# Patient Record
Sex: Male | Born: 1970 | Race: Black or African American | Hispanic: No | Marital: Married | Smoking: Current every day smoker
Health system: Southern US, Community
[De-identification: ages and names within clinical notes are randomized; demographics above are authoritative.]

## PROBLEM LIST (undated history)

## (undated) DIAGNOSIS — I639 Cerebral infarction, unspecified: Secondary | ICD-10-CM

## (undated) DIAGNOSIS — E785 Hyperlipidemia, unspecified: Secondary | ICD-10-CM

## (undated) DIAGNOSIS — I5020 Unspecified systolic (congestive) heart failure: Secondary | ICD-10-CM

## (undated) DIAGNOSIS — R531 Weakness: Secondary | ICD-10-CM

## (undated) HISTORY — DX: Unspecified systolic (congestive) heart failure: I50.20

## (undated) HISTORY — DX: Cerebral infarction, unspecified: I63.9

## (undated) HISTORY — DX: Weakness: R53.1

## (undated) HISTORY — PX: BACK SURGERY: SHX140

## (undated) HISTORY — DX: Hyperlipidemia, unspecified: E78.5

---

## 2009-04-01 ENCOUNTER — Emergency Department (HOSPITAL_COMMUNITY): Admission: EM | Admit: 2009-04-01 | Discharge: 2009-04-01 | Payer: Self-pay | Admitting: Emergency Medicine

## 2011-05-24 ENCOUNTER — Encounter: Payer: Self-pay | Admitting: Emergency Medicine

## 2011-05-24 ENCOUNTER — Emergency Department (HOSPITAL_COMMUNITY)
Admission: EM | Admit: 2011-05-24 | Discharge: 2011-05-24 | Disposition: A | Discharge status: Home / Self Care | Payer: Self-pay | Attending: Emergency Medicine | Admitting: Emergency Medicine

## 2011-05-24 DIAGNOSIS — F17200 Nicotine dependence, unspecified, uncomplicated: Secondary | ICD-10-CM | POA: Insufficient documentation

## 2011-05-24 DIAGNOSIS — R51 Headache: Secondary | ICD-10-CM | POA: Insufficient documentation

## 2011-05-24 MED ORDER — KETOROLAC TROMETHAMINE 30 MG/ML IJ SOLN
30.0000 mg | Freq: Once | INTRAMUSCULAR | Status: AC
  Administered 2011-05-24: 30 mg via INTRAVENOUS
  Filled 2011-05-24: qty 1

## 2011-05-24 MED ORDER — ONDANSETRON HCL 4 MG/2ML IJ SOLN
4.0000 mg | Freq: Once | INTRAMUSCULAR | Status: AC
  Administered 2011-05-24: 4 mg via INTRAVENOUS
  Filled 2011-05-24: qty 2

## 2011-05-24 MED ORDER — SODIUM CHLORIDE 0.9 % IV SOLN
Freq: Once | INTRAVENOUS | Status: AC
  Administered 2011-05-24: 01:00:00 via INTRAVENOUS

## 2011-05-24 MED ORDER — HYDROMORPHONE HCL 1 MG/ML IJ SOLN
1.0000 mg | Freq: Once | INTRAMUSCULAR | Status: AC
  Administered 2011-05-24: 1 mg via INTRAVENOUS
  Filled 2011-05-24: qty 1

## 2011-05-24 NOTE — ED Notes (Signed)
Pt reports improvement in pain following medications.  Denies any pain or nausea at present time.

## 2011-05-24 NOTE — ED Notes (Signed)
Pt reports headache starting today, states that he did have some nausea previously but none at present.  No acute distress noted.

## 2011-05-24 NOTE — ED Provider Notes (Signed)
History     CSN: [id number] Arrival date & time: 05/24/2011 12:38 AM  Chief Complaint  Patient presents with  . Headache   HPI Comments: Seen 23. Patient states he had a headache two days ago that responded to tylenol. Yesterday afternoon developed a fever. Had a single episode of vomiting. Denies vision changes, hearing changes, difficulty swallowing or speaking, stiff neck, fever, chills. Did not take any medicines for the headache.  Patient is a 40 y.o. male presenting with headaches. The history is provided by the patient.  Headache  This is a new problem. The current episode started yesterday. The problem occurs constantly. The problem has not changed since onset.The headache is associated with nothing. The pain is located in the bilateral region. The quality of the pain is described as dull and throbbing. The pain is at a severity of 6/10. The pain is moderate. The pain does not radiate. Pertinent negatives include no fever, no chest pressure, no nausea and no vomiting. He has tried nothing for the symptoms.    History reviewed. No pertinent past medical history.  Past Surgical History  Procedure Date  . Back surgery     No family history on file.  History  Substance Use Topics  . Smoking status: Current Everyday Smoker -- 1.0 packs/day  . Smokeless tobacco: Not on file  . Alcohol Use: No      Review of Systems  Constitutional: Negative for fever.  Gastrointestinal: Negative for nausea and vomiting.  Neurological: Positive for headaches.  All other systems reviewed and are negative.    Physical Exam  BP 154/91  Pulse 75  Temp(Src) 98.5 F (36.9 C) (Oral)  Resp 16  Ht 5\' 11"  (1.803 m)  Wt 150 lb (68.04 kg)  BMI 20.92 kg/m2  Physical Exam  Nursing note and vitals reviewed. Constitutional: He is oriented to person, place, and time. He appears well-developed and well-nourished.  HENT:  Head: Normocephalic and atraumatic.  Right Ear: External ear normal.    Left Ear: External ear normal.  Nose: Nose normal.  Mouth/Throat: Oropharynx is clear and moist.  Eyes: Conjunctivae and EOM are normal. Pupils are equal, round, and reactive to light.  Neck: Normal range of motion. Neck supple.  Cardiovascular: Normal rate, normal heart sounds and intact distal pulses.   Pulmonary/Chest: Effort normal and breath sounds normal.  Abdominal: Soft.  Musculoskeletal: Normal range of motion.  Neurological: He is alert and oriented to person, place, and time. He has normal reflexes.  Skin: Skin is warm and dry.    ED Course  Procedures  Patient who presents with a headache, no neurological deficits. Resolved with analgesics and toradol. Pt feels improved after observation and/or treatment in ED. MDM Reviewed: nursing note and vitals        Nicoletta Dress. Colon Branch, MD 05/24/11 [id number]

## 2011-05-24 NOTE — ED Notes (Signed)
Headache, nausea, vomiting 1 time

## 2013-09-25 ENCOUNTER — Emergency Department (HOSPITAL_COMMUNITY)
Admission: EM | Admit: 2013-09-25 | Discharge: 2013-09-26 | Disposition: A | Discharge status: Home / Self Care | Payer: BC Managed Care – PPO | Attending: Emergency Medicine | Admitting: Emergency Medicine

## 2013-09-25 ENCOUNTER — Emergency Department (HOSPITAL_COMMUNITY): Payer: BC Managed Care – PPO

## 2013-09-25 ENCOUNTER — Encounter (HOSPITAL_COMMUNITY): Payer: Self-pay | Admitting: Emergency Medicine

## 2013-09-25 DIAGNOSIS — Z792 Long term (current) use of antibiotics: Secondary | ICD-10-CM | POA: Insufficient documentation

## 2013-09-25 DIAGNOSIS — K1120 Sialoadenitis, unspecified: Secondary | ICD-10-CM

## 2013-09-25 DIAGNOSIS — J029 Acute pharyngitis, unspecified: Secondary | ICD-10-CM | POA: Insufficient documentation

## 2013-09-25 DIAGNOSIS — K029 Dental caries, unspecified: Secondary | ICD-10-CM

## 2013-09-25 DIAGNOSIS — F17200 Nicotine dependence, unspecified, uncomplicated: Secondary | ICD-10-CM | POA: Insufficient documentation

## 2013-09-25 DIAGNOSIS — M542 Cervicalgia: Secondary | ICD-10-CM | POA: Insufficient documentation

## 2013-09-25 LAB — CBC WITH DIFFERENTIAL/PLATELET
BASOS ABS: 0 10*3/uL (ref 0.0–0.1)
Basophils Relative: 0 % (ref 0–1)
EOS ABS: 0.1 10*3/uL (ref 0.0–0.7)
EOS PCT: 1 % (ref 0–5)
HEMATOCRIT: 39.4 % (ref 39.0–52.0)
Hemoglobin: 13.9 g/dL (ref 13.0–17.0)
Lymphocytes Relative: 21 % (ref 12–46)
Lymphs Abs: 2.3 10*3/uL (ref 0.7–4.0)
MCH: 32.2 pg (ref 26.0–34.0)
MCHC: 35.3 g/dL (ref 30.0–36.0)
MCV: 91.2 fL (ref 78.0–100.0)
MONO ABS: 1 10*3/uL (ref 0.1–1.0)
Monocytes Relative: 9 % (ref 3–12)
Neutro Abs: 7.7 10*3/uL (ref 1.7–7.7)
Neutrophils Relative %: 69 % (ref 43–77)
PLATELETS: 208 10*3/uL (ref 150–400)
RBC: 4.32 MIL/uL (ref 4.22–5.81)
RDW: 13.2 % (ref 11.5–15.5)
WBC: 11.1 10*3/uL — ABNORMAL HIGH (ref 4.0–10.5)

## 2013-09-25 LAB — BASIC METABOLIC PANEL
BUN: 7 mg/dL (ref 6–23)
CALCIUM: 9.7 mg/dL (ref 8.4–10.5)
CO2: 27 mEq/L (ref 19–32)
CREATININE: 0.97 mg/dL (ref 0.50–1.35)
Chloride: 100 mEq/L (ref 96–112)
GFR calc Af Amer: >90 mL/min (ref >90)
GLUCOSE: 93 mg/dL (ref 70–99)
Potassium: 3.8 mEq/L (ref 3.7–5.3)
SODIUM: 141 meq/L (ref 137–147)

## 2013-09-25 MED ORDER — IOHEXOL 300 MG/ML  SOLN
75.0000 mL | Freq: Once | INTRAMUSCULAR | Status: AC | PRN
  Administered 2013-09-25: 75 mL via INTRAVENOUS

## 2013-09-25 MED ORDER — SODIUM CHLORIDE 0.9 % IV SOLN: Freq: Once | INTRAVENOUS | Status: DC

## 2013-09-25 MED ORDER — SODIUM CHLORIDE 0.9 % IV SOLN
Freq: Once | INTRAVENOUS | Status: AC
  Administered 2013-09-25: 23:00:00 via INTRAVENOUS

## 2013-09-25 MED ORDER — KETOROLAC TROMETHAMINE 30 MG/ML IJ SOLN
30.0000 mg | Freq: Once | INTRAMUSCULAR | Status: AC
  Administered 2013-09-25: 30 mg via INTRAVENOUS
  Filled 2013-09-25: qty 1

## 2013-09-25 MED ORDER — METHYLPREDNISOLONE SODIUM SUCC 125 MG IJ SOLR
125.0000 mg | Freq: Once | INTRAMUSCULAR | Status: AC
  Administered 2013-09-25: 125 mg via INTRAVENOUS
  Filled 2013-09-25: qty 2

## 2013-09-25 MED ORDER — CLINDAMYCIN PHOSPHATE 900 MG/50ML IV SOLN
900.0000 mg | Freq: Once | INTRAVENOUS | Status: AC
Start: 2013-09-25 — End: 2013-09-25
  Administered 2013-09-25: 900 mg via INTRAVENOUS
  Filled 2013-09-25: qty 50

## 2013-09-25 NOTE — ED Notes (Signed)
Pt c/o sore throat x 2 days and [facility] but is not getting any better.

## 2013-09-25 NOTE — ED Notes (Signed)
Pt transferred to room 6. Report given to S. Rivka BarbaraFrye-Yarborough, Charity fundraiserN.

## 2013-09-25 NOTE — ED Provider Notes (Signed)
CSN: [id number]     Arrival date & time 09/25/13  2007 History   First MD Initiated Contact with Patient 09/25/13 2044     Chief Complaint  Patient presents with  . Sore Throat   (Consider location/radiation/quality/duration/timing/severity/associated sxs/prior Treatment) HPI Comments: Jermaine Shaw is a 43 y.o. male who presents to the Emergency Department complaining of right sided neck pain and pain underneath his tongue for 2 days.  States that he was seen at another facility earlier today for same and was diagnosed with pharyngitis and given prescriptions for Hycodan syrup and zithromax.  He states the swelling under his tongue has become worse this evening and painful to swallow.  Patient also c/o generalized headache today that has been gradual in onset and similar to previous.  He denies recent illness, fever, cough, chills, or difficulty swallowing or breathing.  Patient also admits to having multiple dental caries.    The history is provided by the patient.    History reviewed. No pertinent past medical history. Past Surgical History  Procedure Laterality Date  . Back surgery     History reviewed. No pertinent family history. History  Substance Use Topics  . Smoking status: Current Every Day Smoker -- 1.00 packs/day  . Smokeless tobacco: Not on file  . Alcohol Use: Yes    Review of Systems  Constitutional: Negative for fever and appetite change.  HENT: Positive for dental problem, sore throat, trouble swallowing and voice change. Negative for congestion, ear pain, facial swelling and hearing loss.   Eyes: Negative for pain and visual disturbance.  Respiratory: Negative for cough, chest tightness and shortness of breath.   Cardiovascular: Negative for chest pain.  Gastrointestinal: Negative for nausea, vomiting and abdominal distention.  Musculoskeletal: Negative for neck pain and neck stiffness.  Skin: Negative for rash.  Neurological: Negative for dizziness, syncope,  facial asymmetry, weakness and headaches.  Hematological: Negative for adenopathy.  All other systems reviewed and are negative.    Allergies  Review of patient's allergies indicates no known allergies.  Home Medications   Current Outpatient Rx  Name  Route  Sig  Dispense  Refill  . azithromycin (ZITHROMAX) 250 MG tablet   Oral   Take 1-2 tablets by mouth daily. Started on 09/25/13         . HYDROcodone-homatropine (HYCODAN) 5-1.5 MG/5ML syrup   Oral   Take 5 mLs by mouth every 6 (six) hours as needed. cough          BP 154/84  Pulse 98  Temp(Src) 99.7 F (37.6 C) (Oral)  Resp 20  Ht 5' 10.5" (1.791 m)  Wt 140 lb (63.504 kg)  BMI 19.80 kg/m2  SpO2 100%   Physical Exam  Nursing note and vitals reviewed. Constitutional: He is oriented to person, place, and time. He appears well-developed and well-nourished. No distress.  HENT:  Head: Normocephalic and atraumatic.  Right Ear: Tympanic membrane and ear canal normal. No mastoid tenderness. No hemotympanum.  Left Ear: Tympanic membrane and ear canal normal. No mastoid tenderness. No hemotympanum.  Nose: Nose normal.  Mouth/Throat: Uvula is midline and mucous membranes are normal. No trismus in the jaw. No uvula swelling. No oropharyngeal exudate, posterior oropharyngeal edema or tonsillar abscesses.    Serous appearing edema of the sublingual region.  No edema of the tongue.  Multiple dental caries and loosening of the right lower central and lateral incisor, cuspid .  No trismus, edema of the gums or obvious dental abscess  Cardiovascular:  Normal rate, regular rhythm, normal heart sounds and intact distal pulses.   No murmur heard. Pulmonary/Chest: Effort normal and breath sounds normal. No respiratory distress. He has no wheezes. He has no rales. He exhibits no tenderness.  Abdominal: Soft. He exhibits no distension. There is no tenderness.  Musculoskeletal: Normal range of motion.  Neurological: He is alert and  oriented to person, place, and time. He displays normal reflexes. He exhibits normal muscle tone. Coordination normal.  Skin: Skin is warm and dry. No rash noted.    ED Course  Procedures (including critical care time) Labs Review Labs Reviewed  CBC WITH DIFFERENTIAL - Abnormal; Notable for the following:    WBC 11.1 (*)    All other components within normal limits  BASIC METABOLIC PANEL   Imaging Review Ct Soft Tissue Neck W Contrast  09/25/2013   CLINICAL DATA:  Sore throat for 2 days.  No improvement.  EXAM: CT NECK WITH CONTRAST  TECHNIQUE: Multidetector CT imaging of the neck was performed using the standard protocol following the bolus administration of intravenous contrast.  CONTRAST:  75mL OMNIPAQUE IOHEXOL 300 MG/ML  SOLN  COMPARISON:  None.  FINDINGS: There is a 4 mm pulmonary nodule, partly imaged in the left upper lobe.  Numerous dental cavities and periapical erosions. Polypoid mucosal thickening in the inferior left maxillary antrum may be odontogenic given the remaining left upper molar has a periapical erosion extending towards the sinus.  There is edema in the soft tissues surrounding the right submandibular gland, which is interstitial edema and mild asymmetric enlargement. No visible stone. No visible ductal dilatation. The other salivary glands appear normal. There is mild local lymphadenopathy. No fluid collection.  Unremarkable thyroid gland. No evidence of mass along the surfaces of the aerodigestive tract.  Prominent number of upper mediastinal lymph nodes. None are enlarged, although there is incomplete imaging of a left lower paratracheal node which measures 12 mm in short axis.  IMPRESSION: 1. Right submandibular sialoadenitis.  No sialolithiasis. 2. Diffuse endodontal/periodontal disease. 3. 4 mm left upper lobe pulmonary nodule. If smoking history see following recommendations.  RECOMMENDATIONS: If the patient is at high risk for bronchogenic carcinoma, follow-up chest CT  at 1year is recommended. If the patient is at low risk, no follow-up is needed. This recommendation follows the consensus statement: Guidelines for Management of Small Pulmonary Nodules Detected on CT Scans: A Statement from the Fleischner Society as published in Radiology 2005; 237:395-400.   Electronically Signed   By: Tiburcio Pea M.D.   On: 09/25/2013 23:01    EKG Interpretation   None       MDM    Patient with sublingual edema and several dental caries.  Palpable nodule to the right submental region.  Airway is patent.  Patient also seen by EDP, Dr. Adriana Simas and care plan discussed.    Will order labs and CT soft tissue neck, IVF's, clindamycin and solumedrol   0100  Patient is feeling better, has received IVF's.  Handles secretions well.  Airway remains patent. Discussed CT findings with the patient and he agrees to close f/u in 1-2 days with his PMD.  Dr. Lysbeth Galas.  I will have him d/c the Zithromax and prescribe clindamycin and prednisone taper.  I have also advised the patient of the left pulmonary nodule seen on the CT scan and recommendations for close f/u with PMD given that patient is a smoker.  Patient verbalized understanding and agrees to care plan.    Jermaine Shaw L.  Trisha Mangle, PA-C 09/26/13 0129

## 2013-09-26 MED ORDER — CLINDAMYCIN HCL 150 MG PO CAPS: 300.0000 mg | ORAL_CAPSULE | Freq: Four times a day (QID) | ORAL | Status: DC

## 2013-09-26 MED ORDER — PREDNISONE 10 MG PO TABS: ORAL_TABLET | ORAL | Status: DC

## 2013-09-26 NOTE — Discharge Instructions (Signed)
Dental Caries Dental caries is tooth decay. This decay can cause a hole in teeth (cavity) that can get bigger and deeper over time. HOME CARE  Brush and floss your teeth. Do this at least two times a day.  Use a fluoride toothpaste.  Use a mouth rinse if told by your dentist or doctor.  Eat less sugary and starchy foods. Drink less sugary drinks.  Avoid snacking often on sugary and starchy foods. Avoid sipping often on sugary drinks.  Keep regular checkups and cleanings with your dentist.  Use fluoride supplements if told by your dentist or doctor.  Allow fluoride to be applied to teeth if told by your dentist or doctor. MAKE SURE YOU:  Understand these instructions.  Will watch your condition.  Will get help right away if you are not doing well or get worse. Document Released: 06/07/2008 Document Revised: 05/01/2013 Document Reviewed: 08/31/2012 [facility] Patient Information 2014 [place] [place].  Sialadenitis Sialadenitis is an inflammation (soreness) of the salivary glands. The parotid is the main salivary gland. It lies behind the angle of the jaw below the ear. The saliva produced comes out of a tiny opening (duct) inside the cheek on either side. This is usually at the level of the upper back teeth. If it is swollen, the ear is pushed up and out. This helps tell this condition apart from a simple lymph gland infection (swollen glands) in the same area. Mumps has mostly disappeared since the start of immunization against mumps. Now the most common cause of parotitis is germ (bacterial) infection or inflammation of the lymphatics (the lymph channels). The other major salivary gland is located in the floor of the mouth. Smaller salivary glands are located in the mouth. This includes the:  Lips.  Lining of the mouth.  Pharynx.  Hard palate (front part of the roof of the mouth). The salivary glands do many things, including:  Lubrication.  Breaking down food.  Production  of hormones and antibodies (to protect against germs which may cause illness).  Help with the sense of taste. ACUTE BACTERIAL SIALADENITIS This is a sudden inflammatory response to bacterial infection. This causes redness, pain, swelling and tenderness over the infected gland. In the past, it was common in dehydrated and debilitated patients often following an operation. It is now more commonly seen:  After radiotherapy.  In patients with poor immune systems. Treatment is:  The correction of fluid balance (rehydration).  Medicine that kill germs (antibiotics).  Pain relief. CHRONIC RECURRENT SIALADENITIS This refers to repeated episodes of discomfort and swelling of one of the salivary glands. It often occurs after eating. Chronic sialadenitis is usually less painful. It is associated with recurrent enlargement of a salivary gland, often following meals, and typically with an absence of redness. The chronic form of the disease often is associated with conditions linked to decreased salivary flow, rather than dehydration (loss of body fluids). These conditions include:  A stone, or concretion, formed in the gallbladder, kidneys, or other parts of the body (calculi).  Salivary stasis.  A change in the fluid and electrolyte (the salts in your body fluids) makeup of the gland. It is treated with:  Gland massage.  Methods to stimulate the flow of saliva, (for example, lemon juice).  Antibiotics if required. Surgery to remove the gland is possible, but its benefits need to be balanced against risks.  VIRAL SIALADENITIS Several viruses infect the salivary glands. Some of these include the mumps virus that commonly infects the parotid gland.  Other viruses causing problems are:  The HIV virus.  Herpes.  Some of the influenza ("flu") viruses. RECURRENT SIALADENITIS IN CHILDREN This condition is thought to be due to swelling or ballooning of the ducts. It results in the same symptoms as  acute bacterial parotitis. It is usually caused by germs (bacteria). It is often treated using penicillin. It may get well without treatment. Surgery is usually not required. TUBERCULOUS SIALADENITIS The salivary glands may become infected with the same bacteria causing tuberculosis ("TB"). Treatment is with anti-tuberculous antibiotic therapy. OTHER UNCOMMON CAUSES OF SIALADENITIS   Sjogren's syndrome is a condition in which arthritis is associated with a decrease in activity of the glands of the body that produce saliva and tears. The diagnosis is made with blood tests or by examination of a piece of tissue from the inside of the lip. Some people with this condition are bothered by:  A dry mouth.  Intermittent salivary gland enlargement.  Atypical mycobacteria is a germ similar to tuberculosis. It often infects children. It is often resistant to antibiotic treatment. It may require surgical treatment to remove the infected salivary gland.  Actinomycosis is an infection of the parotid gland that may also involve the overlying skin. The diagnosis is made by detecting granules of sulphur produced by the bacteria on microscopic examination. Treatment is a prolonged course of penicillin for up to one year.  Nutritional causes include vitamin deficiencies and bulimia.  Diabetes and problems with your thyroid.  Obesity, cirrhosis, and malabsorption are some metabolic causes. HOME CARE INSTRUCTIONS   Apply ice bags every 2 hours for 15-20 minutes, while awake, to the sore gland for 24 hours, then as directed by your caregiver. Place the ice in a plastic bag with a towel around it to prevent frostbite to the skin.  Only take over-the-counter or prescription medicines for pain, discomfort, or fever as directed by your caregiver. SEEK IMMEDIATE MEDICAL CARE IF:   There is increased pain or swelling in your gland that is not controlled with medicine.  An oral temperature above 102 F (38.9 C)  develops, not controlled by medicine.  You develop difficulty opening your mouth, swallowing, or speaking. Document Released: 02/18/2002 Document Revised: 11/21/2011 Document Reviewed: 04/14/2008 [facility] Patient Information 2014 [place], [place].

## 2013-09-28 ENCOUNTER — Encounter (HOSPITAL_COMMUNITY): Payer: Self-pay | Admitting: Emergency Medicine

## 2013-09-28 ENCOUNTER — Emergency Department (HOSPITAL_COMMUNITY)
Admission: EM | Admit: 2013-09-28 | Discharge: 2013-09-28 | Disposition: A | Discharge status: Home / Self Care | Payer: BC Managed Care – PPO | Attending: Emergency Medicine | Admitting: Emergency Medicine

## 2013-09-28 ENCOUNTER — Emergency Department (HOSPITAL_COMMUNITY): Payer: BC Managed Care – PPO

## 2013-09-28 DIAGNOSIS — R110 Nausea: Secondary | ICD-10-CM | POA: Insufficient documentation

## 2013-09-28 DIAGNOSIS — IMO0002 Reserved for concepts with insufficient information to code with codable children: Secondary | ICD-10-CM | POA: Insufficient documentation

## 2013-09-28 DIAGNOSIS — J189 Pneumonia, unspecified organism: Secondary | ICD-10-CM

## 2013-09-28 DIAGNOSIS — Z792 Long term (current) use of antibiotics: Secondary | ICD-10-CM | POA: Insufficient documentation

## 2013-09-28 DIAGNOSIS — J159 Unspecified bacterial pneumonia: Secondary | ICD-10-CM | POA: Insufficient documentation

## 2013-09-28 DIAGNOSIS — F17200 Nicotine dependence, unspecified, uncomplicated: Secondary | ICD-10-CM | POA: Insufficient documentation

## 2013-09-28 MED ORDER — IBUPROFEN 800 MG PO TABS
800.0000 mg | ORAL_TABLET | Freq: Once | ORAL | Status: AC
  Administered 2013-09-28: 800 mg via ORAL
  Filled 2013-09-28: qty 1

## 2013-09-28 MED ORDER — CEFTRIAXONE SODIUM 1 G IJ SOLR
1.0000 g | Freq: Once | INTRAMUSCULAR | Status: AC
  Administered 2013-09-28: 1 g via INTRAMUSCULAR
  Filled 2013-09-28: qty 10

## 2013-09-28 MED ORDER — AZITHROMYCIN 250 MG PO TABS
500.0000 mg | ORAL_TABLET | Freq: Once | ORAL | Status: AC
  Administered 2013-09-28: 500 mg via ORAL
  Filled 2013-09-28: qty 2

## 2013-09-28 NOTE — ED Provider Notes (Signed)
Medical screening examination/treatment/procedure(s) were conducted as a shared visit with non-physician practitioner(s) and myself.  I personally evaluated the patient during the encounter.  EKG Interpretation   None      No meningeal signs.  CT scan shows right submandibular sialoadenitis. No sialolithiasis.  Good airway. Swallowing normally.  Recommended recheck within 24 hours.  Donnetta HutchingBrian Alaijah Gibler, MD 09/28/13 [phone]

## 2013-09-28 NOTE — ED Notes (Signed)
Pt co fever, chills, generalized body aches, headache since midnight

## 2013-09-28 NOTE — ED Notes (Signed)
Pt states he has had a headache since he "got the chills" and states he has felt feverish since midnight.

## 2013-09-28 NOTE — ED Notes (Signed)
Pt awaiting shot time, can be dc at 0956

## 2013-09-28 NOTE — ED Provider Notes (Signed)
CSN: [id number]     Arrival date & time 09/28/13  0804 History   First MD Initiated Contact with Patient 09/28/13 [phone]     Chief Complaint  Patient presents with  . Influenza     flu like illness   (Consider location/radiation/quality/duration/timing/severity/associated sxs/prior Treatment) Patient is a 43 y.o. male presenting with flu symptoms. The history is provided by the patient. No language interpreter was used.  Influenza Presenting symptoms: cough, fever, myalgias and nausea   Severity:  Moderate Onset quality:  Sudden Duration:  1 day Progression:  Worsening Chronicity:  New Relieved by:  Nothing Worsened by:  Nothing tried Ineffective treatments:  None tried Associated symptoms: chills   Risk factors: sick contacts   Risk factors: no immunocompromised state     History reviewed. No pertinent past medical history. Past Surgical History  Procedure Laterality Date  . Back surgery     History reviewed. No pertinent family history. History  Substance Use Topics  . Smoking status: Current Every Day Smoker -- 1.00 packs/day  . Smokeless tobacco: Not on file  . Alcohol Use: Yes    Review of Systems  Constitutional: Positive for fever and chills.  Respiratory: Positive for cough.   Gastrointestinal: Positive for nausea.  Musculoskeletal: Positive for myalgias.  All other systems reviewed and are negative.    Allergies  Review of patient's allergies indicates no known allergies.  Home Medications   Current Outpatient Rx  Name  Route  Sig  Dispense  Refill  . azithromycin (ZITHROMAX) 250 MG tablet   Oral   Take 1-2 tablets by mouth daily. Started on 09/25/13         . clindamycin (CLEOCIN) 150 MG capsule   Oral   Take 2 capsules (300 mg total) by mouth 4 (four) times daily. For 7 days   56 capsule   0   . HYDROcodone-homatropine (HYCODAN) 5-1.5 MG/5ML syrup   Oral   Take 5 mLs by mouth every 6 (six) hours as needed. cough         . predniSONE  (DELTASONE) 10 MG tablet      Take 6 tablets day one, 5 tablets day two, 4 tablets day three, 3 tablets day four, 2 tablets day five, then 1 tablet day six   21 tablet   0    BP 115/62  Pulse 117  Temp(Src) 101 F (38.3 C) (Oral)  Resp 16  SpO2 98% Physical Exam  Nursing note and vitals reviewed. Constitutional: He is oriented to person, place, and time. He appears well-developed and well-nourished.  HENT:  Head: Normocephalic and atraumatic.  Eyes: Conjunctivae and EOM are normal. Pupils are equal, round, and reactive to light.  Neck: Normal range of motion.  Cardiovascular: Normal rate and normal heart sounds.   Pulmonary/Chest: Effort normal and breath sounds normal.  Abdominal: Soft. He exhibits no distension.  Musculoskeletal: Normal range of motion.  Neurological: He is alert and oriented to person, place, and time.  Skin: Skin is warm.  Psychiatric: He has a normal mood and affect.    ED Course  Procedures (including critical care time) Labs Review Labs Reviewed - No data to display Imaging Review Dg Chest 2 View  09/28/2013   CLINICAL DATA:  Cough and congestion  EXAM: CHEST  2 VIEW  COMPARISON:  04/01/2009  FINDINGS: Heart size and vascular pattern normal. Left lung is clear. There is mild patchy posterior root medial right lower lobe infiltrate.  IMPRESSION: Right lower lobe infiltrate  concerning for pneumonia.   Electronically Signed   By: Esperanza Heir M.D.   On: 09/28/2013 09:12    EKG Interpretation   None       MDM rll pneumonia.  Pt given rocephin and zithromax.   Pt given rx for zithromax.  Pt advised to follow up with primary MD for recheck.  Return if any problems.   1. Community acquired pneumonia     Lonia Skinner Gamaliel, [place] 09/28/13 [phone]

## 2013-09-28 NOTE — Discharge Instructions (Signed)

## 2013-09-28 NOTE — ED Notes (Signed)
Discharge instructions reviewed with pt, questions answered. Pt verbalized understanding.  

## 2013-09-28 NOTE — ED Provider Notes (Signed)
Medical screening examination/treatment/procedure(s) were performed by non-physician practitioner and as supervising physician I was immediately available for consultation/collaboration.  EKG Interpretation   None        Donnetta HutchingBrian Kennette Cuthrell, MD 09/28/13 1431

## 2014-05-07 ENCOUNTER — Emergency Department (HOSPITAL_COMMUNITY)
Admission: EM | Admit: 2014-05-07 | Discharge: 2014-05-07 | Disposition: A | Discharge status: Home / Self Care | Payer: BC Managed Care – PPO | Attending: Emergency Medicine | Admitting: Emergency Medicine

## 2014-05-07 ENCOUNTER — Encounter (HOSPITAL_COMMUNITY): Payer: Self-pay | Admitting: Emergency Medicine

## 2014-05-07 DIAGNOSIS — F17200 Nicotine dependence, unspecified, uncomplicated: Secondary | ICD-10-CM | POA: Insufficient documentation

## 2014-05-07 DIAGNOSIS — G43D0 Abdominal migraine, not intractable: Secondary | ICD-10-CM

## 2014-05-07 DIAGNOSIS — G43809 Other migraine, not intractable, without status migrainosus: Secondary | ICD-10-CM | POA: Insufficient documentation

## 2014-05-07 DIAGNOSIS — R112 Nausea with vomiting, unspecified: Secondary | ICD-10-CM | POA: Diagnosis present

## 2014-05-07 MED ORDER — KETOROLAC TROMETHAMINE 30 MG/ML IJ SOLN
30.0000 mg | Freq: Once | INTRAMUSCULAR | Status: AC
  Administered 2014-05-07: 30 mg via INTRAVENOUS
  Filled 2014-05-07: qty 1

## 2014-05-07 MED ORDER — METOCLOPRAMIDE HCL 5 MG/ML IJ SOLN
10.0000 mg | Freq: Once | INTRAMUSCULAR | Status: AC
  Administered 2014-05-07: 10 mg via INTRAVENOUS
  Filled 2014-05-07: qty 2

## 2014-05-07 MED ORDER — METOCLOPRAMIDE HCL 10 MG PO TABS: 10.0000 mg | ORAL_TABLET | Freq: Four times a day (QID) | ORAL | Status: DC | PRN

## 2014-05-07 MED ORDER — SODIUM CHLORIDE 0.9 % IV BOLUS (SEPSIS)
1000.0000 mL | Freq: Once | INTRAVENOUS | Status: AC
  Administered 2014-05-07: 1000 mL via INTRAVENOUS

## 2014-05-07 MED ORDER — NAPROXEN 500 MG PO TABS: 500.0000 mg | ORAL_TABLET | Freq: Two times a day (BID) | ORAL | Status: DC | PRN

## 2014-05-07 MED ORDER — DIPHENHYDRAMINE HCL 50 MG/ML IJ SOLN
25.0000 mg | Freq: Once | INTRAMUSCULAR | Status: AC
  Administered 2014-05-07: 25 mg via INTRAVENOUS
  Filled 2014-05-07: qty 1

## 2014-05-07 NOTE — ED Notes (Signed)
Pt got off work this morning around 7 and had a little headache at that time but went on to sleep. When he woke up around 2pm , he never could go back to sleep because of the headache. He states he hasn't been able to eat either.

## 2014-05-07 NOTE — ED Provider Notes (Signed)
CSN: [id number]     Arrival date & time 05/07/14  2122 History  This chart was scribed for Rolland Porter, MD by Evon Slack, ED Scribe. This patient was seen in room APA14/APA14 and the patient's care was started at 10:30 PM.      Chief Complaint  Patient presents with  . Migraine  . Nausea   Patient is a 43 y.o. male presenting with migraines. The history is provided by the patient. No language interpreter was used.  Migraine Associated symptoms include headaches. Pertinent negatives include no chest pain, no abdominal pain and no shortness of breath.   HPI Comments: Jermaine Shaw is a 43 y.o. male who presents to the Emergency Department complaining of headache onset this morning. Associated nausea, vomiting, and photophobia. He states that he works third shift and was still present when he woke up. He states he hasn't taken any medications prior to arrival. He states he has a HX of coming to the ED for headaches. Denies fever, neck pain or neck stiffness.    History reviewed. No pertinent past medical history. Past Surgical History  Procedure Laterality Date  . Back surgery     History reviewed. No pertinent family history. History  Substance Use Topics  . Smoking status: Current Every Day Smoker -- 1.00 packs/day  . Smokeless tobacco: Not on file  . Alcohol Use: Yes    Review of Systems  Constitutional: Negative for fever, chills, diaphoresis, appetite change and fatigue.  HENT: Negative for mouth sores, sore throat and trouble swallowing.   Eyes: Positive for photophobia. Negative for visual disturbance.  Respiratory: Negative for cough, chest tightness, shortness of breath and wheezing.   Cardiovascular: Negative for chest pain.  Gastrointestinal: Positive for nausea and vomiting. Negative for abdominal pain, diarrhea and abdominal distention.  Endocrine: Negative for polydipsia, polyphagia and polyuria.  Genitourinary: Negative for dysuria, frequency and hematuria.   Musculoskeletal: Negative for gait problem, neck pain and neck stiffness.  Skin: Negative for color change, pallor and rash.  Neurological: Positive for headaches. Negative for dizziness, syncope and light-headedness.  Hematological: Does not bruise/bleed easily.  Psychiatric/Behavioral: Negative for behavioral problems and confusion.    Allergies  Review of patient's allergies indicates no known allergies.  Home Medications   Prior to Admission medications   Medication Sig Start Date End Date Taking? Authorizing Provider  metoCLOPramide (REGLAN) 10 MG tablet Take 1 tablet (10 mg total) by mouth every 6 (six) hours as needed (headache). 05/07/14   Rolland Porter, MD  naproxen (NAPROSYN) 500 MG tablet Take 1 tablet (500 mg total) by mouth 2 (two) times daily as needed for headache. 05/07/14   Rolland Porter, MD   Triage Vitals: BP 148/87  Pulse 79  Temp(Src) 98.4 F (36.9 C) (Oral)  Resp 20  Ht 5' 10.5" (1.791 m)  SpO2 100%  Physical Exam  Nursing note and vitals reviewed. Constitutional: He is oriented to person, place, and time. He appears well-developed and well-nourished. No distress.  HENT:  Head: Normocephalic.  bilateral retro orbital headache  Eyes: Conjunctivae are normal. Pupils are equal, round, and reactive to light. No scleral icterus.  Neck: Normal range of motion. Neck supple. No thyromegaly present.  Cardiovascular: Normal rate and regular rhythm.  Exam reveals no gallop and no friction rub.   No murmur heard. Pulmonary/Chest: Effort normal and breath sounds normal. No respiratory distress. He has no wheezes. He has no rales.  Abdominal: Soft. Bowel sounds are normal. He exhibits no distension. There  is no tenderness. There is no rebound.  Musculoskeletal: Normal range of motion.  Neurological: He is alert and oriented to person, place, and time.  Skin: Skin is warm and dry. No rash noted.  Psychiatric: He has a normal mood and affect. His behavior is normal.    ED  Course  Procedures (including critical care time) DIAGNOSTIC STUDIES: Oxygen Saturation is 100% on RA, normal by my interpretation.    COORDINATION OF CARE: 10:40 PM-Discussed treatment plan which includes IV medications with pt at bedside and pt agreed to plan.     Labs Review Labs Reviewed - No data to display  Imaging Review No results found.   EKG Interpretation None      MDM   Final diagnoses:  Abdominal migraine, not intractable    Still with near complete resolution of his headache. Discharged home. Prescription for metoclopramide, and naproxen.    I personally performed the services described in this documentation, which was scribed in my presence. The recorded information has been reviewed and is accurate.      Rolland Porter, MD 05/07/14 [phone]

## 2014-05-07 NOTE — Discharge Instructions (Signed)

## 2014-05-07 NOTE — ED Notes (Signed)
Pt reports hx of migraine HA. Did not take any OTC meds for pain relief.

## 2015-05-02 IMAGING — CT CT NECK W/ CM
3 of 4 series · 12 of 33 positions shown, 14 images · IV contrast (Omnipaque 300)
Comparison: None.

CLINICAL DATA: Sore throat for 2 days.  No improvement.

EXAM:
CT NECK WITH CONTRAST
TECHNIQUE: Multidetector CT imaging of the neck was performed using the
standard protocol following the bolus administration of intravenous
contrast.
CONTRAST:  75mL OMNIPAQUE IOHEXOL 300 MG/ML  SOLN

[Series 2: soft tissue neck 2.0 b31s · axial · 0.36mm/px · z∈[+108,+260]mm · 4 of 116 slices shown, 5 images]
[im 20/116  soft-tissue]
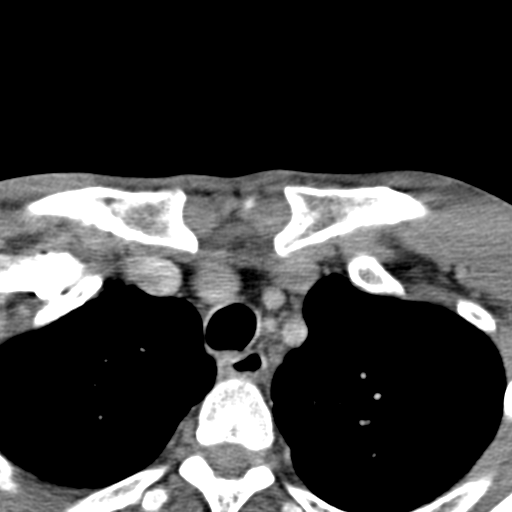
[im 20/116  bone]
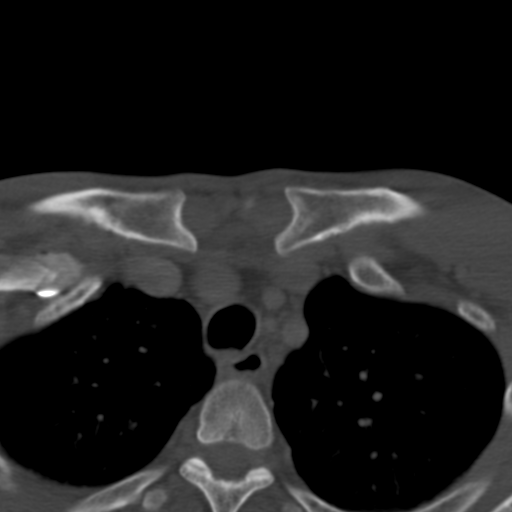
[im 39/116  bone]
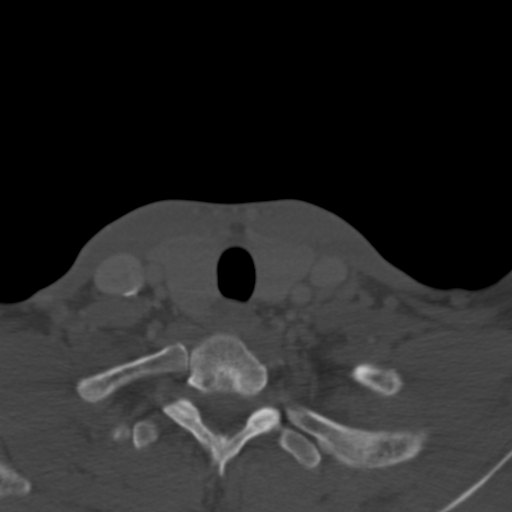
[im 77/116  bone]
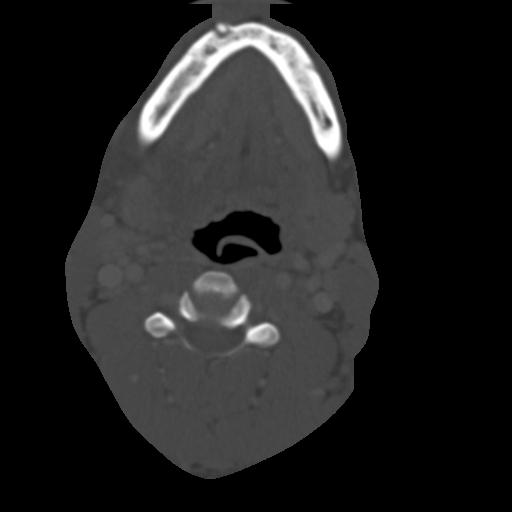
[im 96/116  bone]
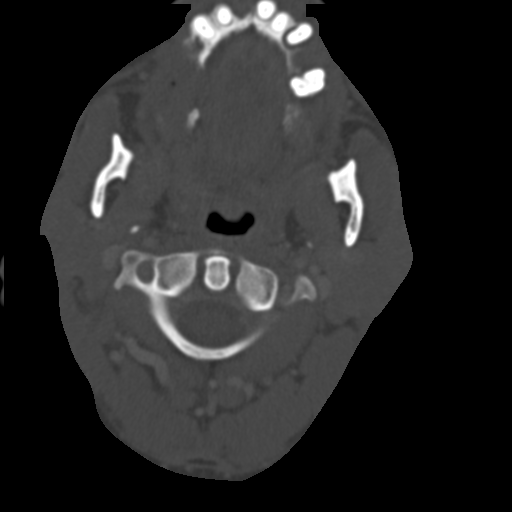

[Series 4: neck 2.0 soft tissue sag · sagittal · 0.32mm/px · 5 of 68 slices shown, 6 images]
[im 23/68  bone]
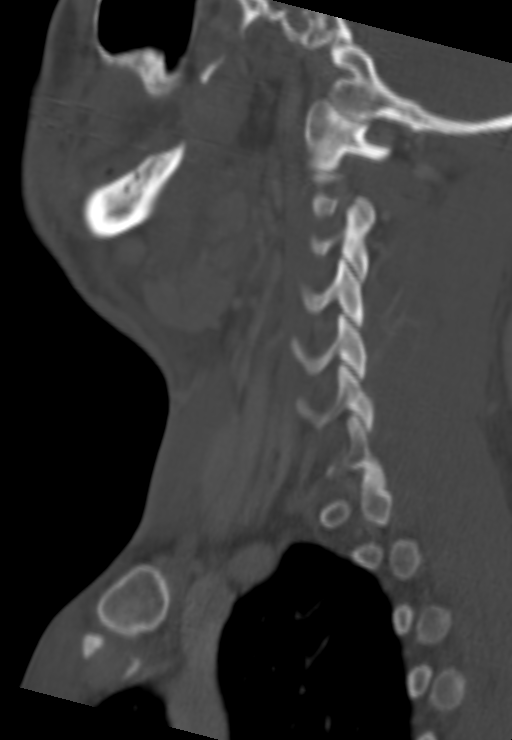
[im 28/68  bone]
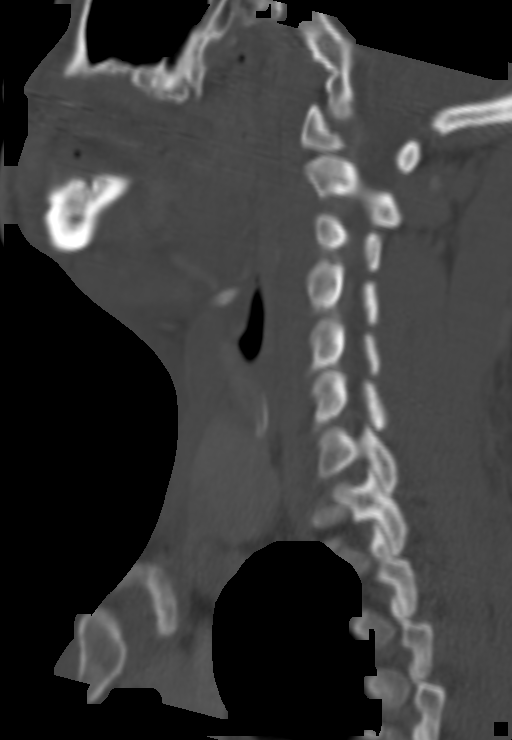
[im 34/68  soft-tissue]
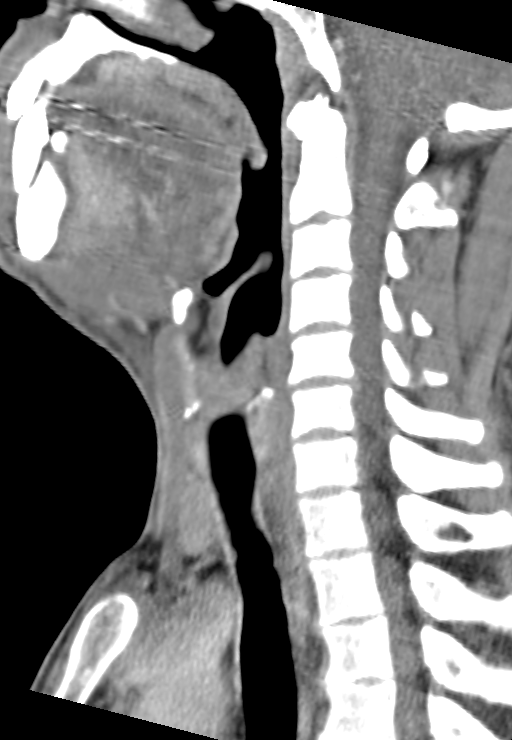
[im 34/68  bone]
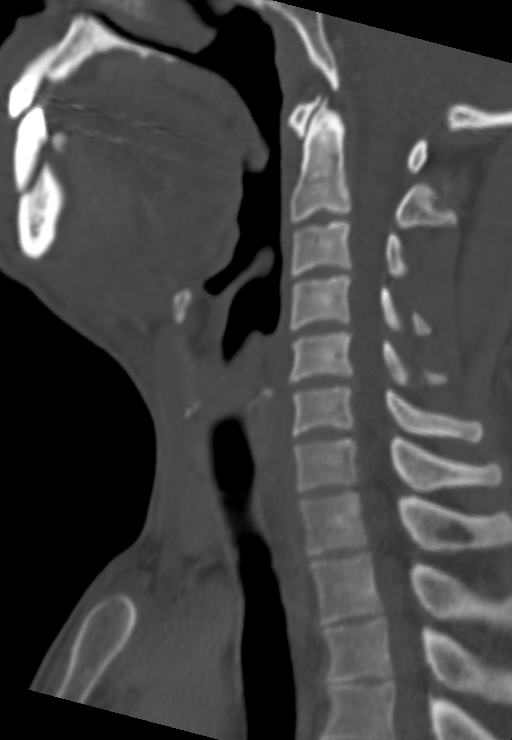
[im 40/68  bone]
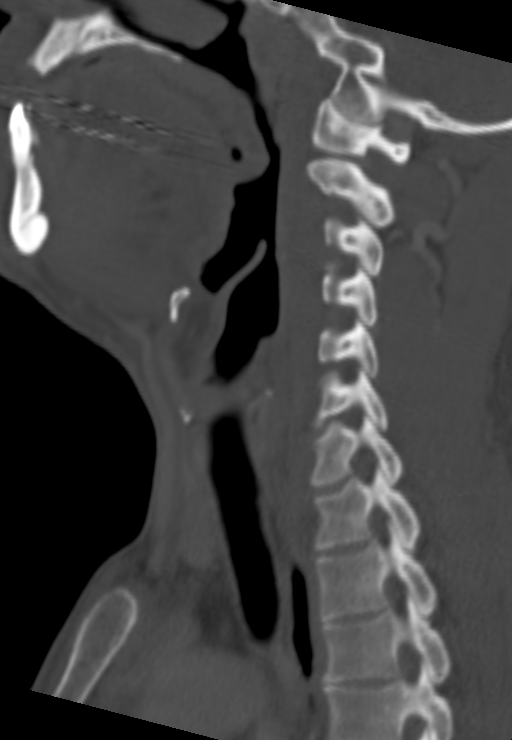
[im 45/68  bone]
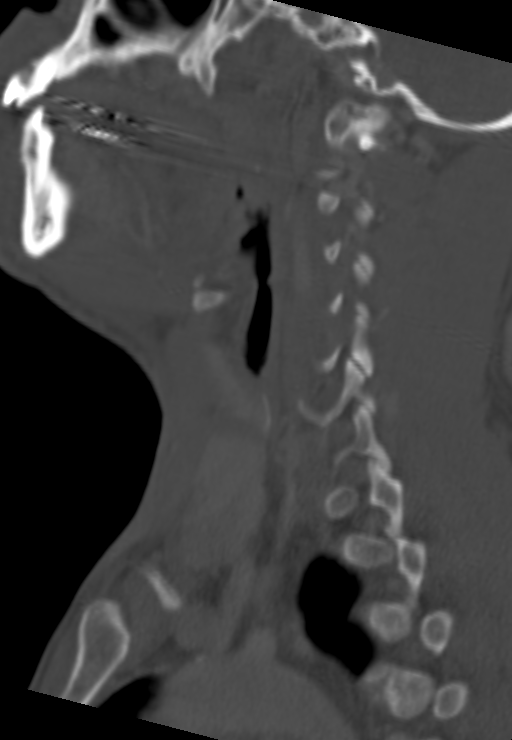

[Series 5: neck 2.0 soft tissue coro · coronal · 0.29mm/px · 3 of 83 slices shown]
[im 17/83  bone]
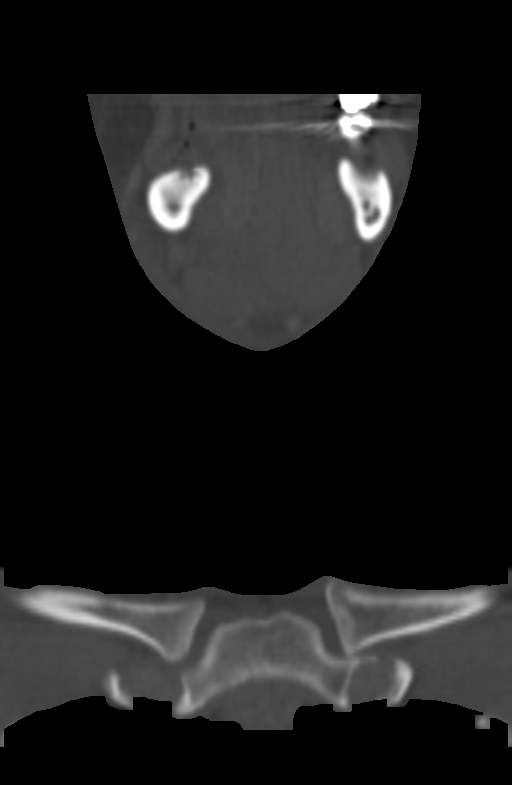
[im 33/83  bone]
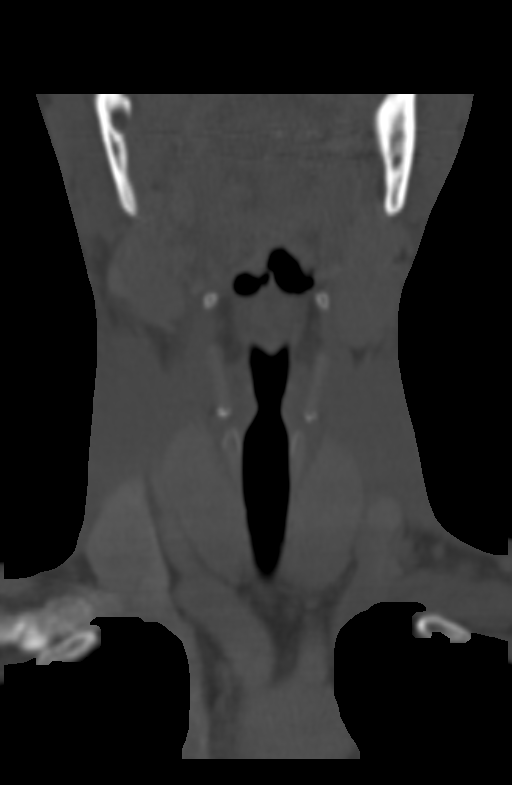
[im 50/83  bone]
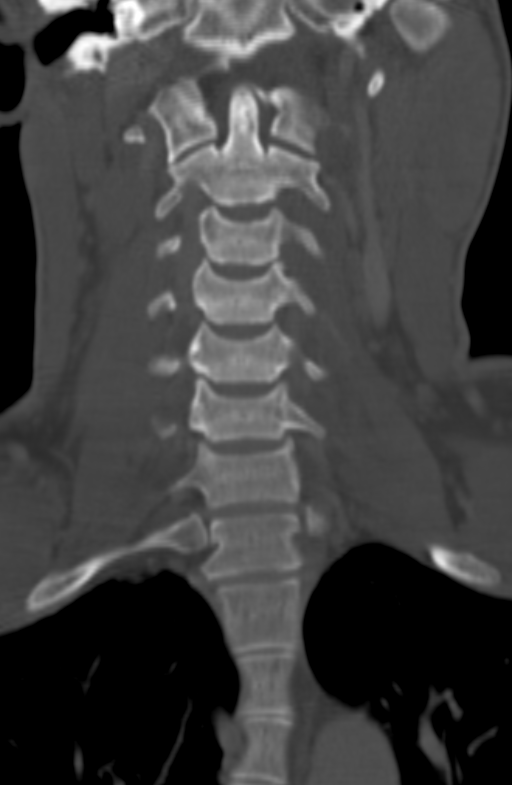

[12 of 33 positions shown; findings below may reference images not displayed]

FINDINGS: There is a 4 mm pulmonary nodule, partly imaged in the left upper
lobe.

Numerous dental cavities and periapical erosions. Polypoid mucosal
thickening in the inferior left maxillary antrum may be odontogenic
given the remaining left upper molar has a periapical erosion
extending towards the sinus.

There is edema in the soft tissues surrounding the right
submandibular gland, which is interstitial edema and mild asymmetric
enlargement. No visible stone. No visible ductal dilatation. The
other salivary glands appear normal. There is mild local
lymphadenopathy. No fluid collection.

Unremarkable thyroid gland. No evidence of mass along the surfaces
of the aerodigestive tract.

Prominent number of upper mediastinal lymph nodes. None are
enlarged, although there is incomplete imaging of a left lower
paratracheal node which measures 12 mm in short axis.
IMPRESSION: 1. Right submandibular sialoadenitis.  No sialolithiasis.
2. Diffuse endodontal/periodontal disease.
3. 4 mm left upper lobe pulmonary nodule. If smoking history see
following recommendations.

RECOMMENDATIONS:
If the patient is at high risk for bronchogenic carcinoma, follow-up
chest CT at 9year is recommended. If the patient is at low risk, no
follow-up is needed. This recommendation follows the consensus
statement: Guidelines for Management of Small Pulmonary Nodules
Detected on CT Scans: A Statement from the [HOSPITAL] as

## 2023-01-30 ENCOUNTER — Emergency Department (HOSPITAL_COMMUNITY): Payer: BC Managed Care – PPO

## 2023-01-30 ENCOUNTER — Other Ambulatory Visit (HOSPITAL_COMMUNITY): Payer: Self-pay | Admitting: *Deleted

## 2023-01-30 ENCOUNTER — Inpatient Hospital Stay (HOSPITAL_COMMUNITY)
Admission: EM | Admit: 2023-01-30 | Discharge: 2023-02-01 | DRG: 064 | Disposition: A | Discharge status: Home / Self Care | Payer: BC Managed Care – PPO | Attending: Family Medicine | Admitting: Family Medicine

## 2023-01-30 ENCOUNTER — Observation Stay (HOSPITAL_COMMUNITY): Payer: BC Managed Care – PPO

## 2023-01-30 ENCOUNTER — Other Ambulatory Visit: Payer: Self-pay

## 2023-01-30 ENCOUNTER — Encounter (HOSPITAL_COMMUNITY): Payer: Self-pay

## 2023-01-30 DIAGNOSIS — R471 Dysarthria and anarthria: Secondary | ICD-10-CM | POA: Diagnosis present

## 2023-01-30 DIAGNOSIS — I639 Cerebral infarction, unspecified: Secondary | ICD-10-CM | POA: Diagnosis not present

## 2023-01-30 DIAGNOSIS — Z79899 Other long term (current) drug therapy: Secondary | ICD-10-CM

## 2023-01-30 DIAGNOSIS — I6381 Other cerebral infarction due to occlusion or stenosis of small artery: Secondary | ICD-10-CM | POA: Diagnosis not present

## 2023-01-30 DIAGNOSIS — M5412 Radiculopathy, cervical region: Secondary | ICD-10-CM | POA: Diagnosis present

## 2023-01-30 DIAGNOSIS — I6389 Other cerebral infarction: Secondary | ICD-10-CM | POA: Diagnosis not present

## 2023-01-30 DIAGNOSIS — I5021 Acute systolic (congestive) heart failure: Secondary | ICD-10-CM | POA: Diagnosis present

## 2023-01-30 DIAGNOSIS — I110 Hypertensive heart disease with heart failure: Secondary | ICD-10-CM | POA: Diagnosis present

## 2023-01-30 DIAGNOSIS — F17210 Nicotine dependence, cigarettes, uncomplicated: Secondary | ICD-10-CM | POA: Diagnosis present

## 2023-01-30 DIAGNOSIS — R29702 NIHSS score 2: Secondary | ICD-10-CM | POA: Diagnosis present

## 2023-01-30 DIAGNOSIS — I5020 Unspecified systolic (congestive) heart failure: Secondary | ICD-10-CM

## 2023-01-30 DIAGNOSIS — R200 Anesthesia of skin: Secondary | ICD-10-CM | POA: Diagnosis present

## 2023-01-30 LAB — CBC WITH DIFFERENTIAL/PLATELET
Abs Immature Granulocytes: 0.01 10*3/uL (ref 0.00–0.07)
Basophils Absolute: 0 10*3/uL (ref 0.0–0.1)
Basophils Relative: 0 %
Eosinophils Absolute: 0.1 10*3/uL (ref 0.0–0.5)
Eosinophils Relative: 2 %
HCT: 42.6 % (ref 39.0–52.0)
Hemoglobin: 14.2 g/dL (ref 13.0–17.0)
Immature Granulocytes: 0 %
Lymphocytes Relative: 48 %
Lymphs Abs: 3.2 10*3/uL (ref 0.7–4.0)
MCH: 31.7 pg (ref 26.0–34.0)
MCHC: 33.3 g/dL (ref 30.0–36.0)
MCV: 95.1 fL (ref 80.0–100.0)
Monocytes Absolute: 0.6 10*3/uL (ref 0.1–1.0)
Monocytes Relative: 9 %
Neutro Abs: 2.8 10*3/uL (ref 1.7–7.7)
Neutrophils Relative %: 41 %
Platelets: 221 10*3/uL (ref 150–400)
RBC: 4.48 MIL/uL (ref 4.22–5.81)
RDW: 13.1 % (ref 11.5–15.5)
WBC: 6.7 10*3/uL (ref 4.0–10.5)
nRBC: 0 % (ref 0.0–0.2)

## 2023-01-30 LAB — CREATININE, SERUM
Creatinine, Ser: 0.89 mg/dL (ref 0.61–1.24)
GFR, Estimated: >60 mL/min (ref >60)

## 2023-01-30 LAB — BASIC METABOLIC PANEL
Anion gap: 10 (ref 5–15)
BUN: 11 mg/dL (ref 6–20)
CO2: 23 mmol/L (ref 22–32)
Calcium: 9 mg/dL (ref 8.9–10.3)
Chloride: 108 mmol/L (ref 98–111)
Creatinine, Ser: 0.95 mg/dL (ref 0.61–1.24)
GFR, Estimated: >60 mL/min (ref >60)
Glucose, Bld: 140 mg/dL — ABNORMAL HIGH (ref 70–99)
Potassium: 3.6 mmol/L (ref 3.5–5.1)
Sodium: 141 mmol/L (ref 135–145)

## 2023-01-30 LAB — CBC
HCT: 41 % (ref 39.0–52.0)
Hemoglobin: 13.9 g/dL (ref 13.0–17.0)
MCH: 31.8 pg (ref 26.0–34.0)
MCHC: 33.9 g/dL (ref 30.0–36.0)
MCV: 93.8 fL (ref 80.0–100.0)
Platelets: 219 10*3/uL (ref 150–400)
RBC: 4.37 MIL/uL (ref 4.22–5.81)
RDW: 12.9 % (ref 11.5–15.5)
WBC: 11.4 10*3/uL — ABNORMAL HIGH (ref 4.0–10.5)
nRBC: 0 % (ref 0.0–0.2)

## 2023-01-30 LAB — ECHOCARDIOGRAM COMPLETE
AR max vel: 3.99 cm2
AV Area VTI: 2.28 cm2
AV Area mean vel: 1.89 cm2
AV Mean grad: 2.5 mmHg
AV Peak grad: 1.4 mmHg
Ao pk vel: 0.58 m/s
Area-P 1/2: 3.63 cm2
Height: 68 in
MV M vel: 4.3 m/s
MV Peak grad: 73.8 mmHg
S' Lateral: 4.6 cm
Weight: 2511.48 oz

## 2023-01-30 LAB — HEMOGLOBIN A1C
Hgb A1c MFr Bld: 6.2 % — ABNORMAL HIGH (ref 4.8–5.6)
Mean Plasma Glucose: 131.24 mg/dL

## 2023-01-30 LAB — HIV ANTIBODY (ROUTINE TESTING W REFLEX): HIV Screen 4th Generation wRfx: NONREACTIVE

## 2023-01-30 MED ORDER — ENOXAPARIN SODIUM 40 MG/0.4ML IJ SOSY
40.0000 mg | PREFILLED_SYRINGE | INTRAMUSCULAR | Status: DC
  Administered 2023-01-30 – 2023-01-31 (×2): 40 mg via SUBCUTANEOUS
  Filled 2023-01-30 (×2): qty 0.4

## 2023-01-30 MED ORDER — SODIUM CHLORIDE 0.9 % IV SOLN: INTRAVENOUS | Status: AC

## 2023-01-30 MED ORDER — ACETAMINOPHEN 325 MG PO TABS
650.0000 mg | ORAL_TABLET | ORAL | Status: DC | PRN
  Administered 2023-01-30: 650 mg via ORAL
  Filled 2023-01-30: qty 2

## 2023-01-30 MED ORDER — ASPIRIN 81 MG PO TBEC
81.0000 mg | DELAYED_RELEASE_TABLET | Freq: Every day | ORAL | Status: DC
  Administered 2023-01-31 – 2023-02-01 (×2): 81 mg via ORAL
  Filled 2023-01-30 (×2): qty 1

## 2023-01-30 MED ORDER — HYDRALAZINE HCL 20 MG/ML IJ SOLN: 10.0000 mg | INTRAMUSCULAR | Status: DC | PRN

## 2023-01-30 MED ORDER — CLOPIDOGREL BISULFATE 75 MG PO TABS
75.0000 mg | ORAL_TABLET | Freq: Every day | ORAL | Status: DC
  Administered 2023-01-31 – 2023-02-01 (×2): 75 mg via ORAL
  Filled 2023-01-30 (×2): qty 1

## 2023-01-30 MED ORDER — STROKE: EARLY STAGES OF RECOVERY BOOK
Freq: Once | Status: AC
  Filled 2023-01-30: qty 1

## 2023-01-30 MED ORDER — ASPIRIN 300 MG RE SUPP: 300.0000 mg | Freq: Every day | RECTAL | Status: DC

## 2023-01-30 MED ORDER — ACETAMINOPHEN 160 MG/5ML PO SOLN: 650.0000 mg | ORAL | Status: DC | PRN

## 2023-01-30 MED ORDER — ACETAMINOPHEN 650 MG RE SUPP: 650.0000 mg | RECTAL | Status: DC | PRN

## 2023-01-30 MED ORDER — PERFLUTREN LIPID MICROSPHERE
1.0000 mL | INTRAVENOUS | Status: AC | PRN
  Administered 2023-01-30: 10 mL via INTRAVENOUS

## 2023-01-30 MED ORDER — ASPIRIN 325 MG PO TABS
325.0000 mg | ORAL_TABLET | Freq: Every day | ORAL | Status: DC
  Administered 2023-01-30: 325 mg via ORAL
  Filled 2023-01-30: qty 1

## 2023-01-30 NOTE — ED Notes (Signed)
Pt given sprite at this time. 

## 2023-01-30 NOTE — ED Provider Notes (Signed)
[place] EMERGENCY DEPARTMENT AT [facility] Provider Note   CSN: [id number] Arrival date & time: 01/30/23  0242     History  Chief Complaint  Patient presents with   Numbness    Left side    Jermaine Shaw is a 52 y.o. male.  Patient is a 52 year old male with no significant past medical history.  Patient presenting today with complaints of left arm numbness.  He went to bed at approximately 11 PM, then woke up at 1:50 AM with left arm numbness, discomfort to the left side of the neck and head, and mild headache.  He took a [place] powder prior to coming here and his headache has since resolved.  He also reports some improvement of the numbness since the onset.  No aggravating factors.  The history is provided by the patient.       Home Medications Prior to Admission medications   Medication Sig Start Date End Date Taking? Authorizing Provider  metoCLOPramide (REGLAN) 10 MG tablet Take 1 tablet (10 mg total) by mouth every 6 (six) hours as needed (headache). 05/07/14   Rolland Porter, MD  naproxen (NAPROSYN) 500 MG tablet Take 1 tablet (500 mg total) by mouth 2 (two) times daily as needed for headache. 05/07/14   Rolland Porter, MD      Allergies    Patient has no known allergies.    Review of Systems   Review of Systems  All other systems reviewed and are negative.   Physical Exam Updated Vital Signs BP (!) 191/91   Pulse (!) 102   Temp 98.3 F (36.8 C) (Oral)   Resp 18   Ht 5' 10.5" (1.791 m)   Wt 72.6 kg   SpO2 100%   BMI 22.63 kg/m  Physical Exam Vitals and nursing note reviewed.  Constitutional:      General: He is not in acute distress.    Appearance: He is well-developed. He is not diaphoretic.  HENT:     Head: Normocephalic and atraumatic.  Eyes:     Extraocular Movements: Extraocular movements intact.     Pupils: Pupils are equal, round, and reactive to light.  Cardiovascular:     Rate and Rhythm: Normal rate and regular rhythm.     Heart  sounds: No murmur heard.    No friction rub.  Pulmonary:     Effort: Pulmonary effort is normal. No respiratory distress.     Breath sounds: Normal breath sounds. No wheezing or rales.  Abdominal:     General: Bowel sounds are normal. There is no distension.     Palpations: Abdomen is soft.     Tenderness: There is no abdominal tenderness.  Musculoskeletal:        General: Normal range of motion.     Cervical back: Normal range of motion and neck supple.  Skin:    General: Skin is warm and dry.  Neurological:     General: No focal deficit present.     Mental Status: He is alert and oriented to person, place, and time.     Cranial Nerves: No cranial nerve deficit.     Motor: No weakness.     Coordination: Coordination normal.     ED Results / Procedures / Treatments   Labs (all labs ordered are listed, but only abnormal results are displayed) Labs Reviewed - No data to display  EKG EKG Interpretation  Date/Time:  Monday Jan 30 2023 03:01:13 EDT Ventricular Rate:  78 PR  Interval:  111 QRS Duration: 92 QT Interval:  381 QTC Calculation: 434 R Axis:   60 Text Interpretation: Sinus rhythm Borderline short PR interval Consider left ventricular hypertrophy Confirmed by Geoffery Lyons ([id number]) on 01/30/2023 3:13:26 AM  Radiology No results found.  Procedures Procedures    Medications Ordered in ED Medications - No data to display  ED Course/ Medical Decision Making/ A&P  Patient is a 52 year old male presenting with complaints of numbness to the left side of his neck and left arm that woke him from sleep.  He was also experiencing headache, but improved after taking [place] powder.  Patient arrives here with stable vital signs and appears neurologically intact.  His main complaint is subjective numbness to the left arm and neck.  Workup initiated including CBC and basic metabolic panel showing no acute abnormalities.  CT scan of the head obtained showing advanced,  age-indeterminate cerebral white matter changes more pronounced in the left hemisphere, but no other acute intracranial abnormality.  Patient's presentation most consistent with a cervical radiculopathy, however feel as though further imaging is required to rule out stroke.  MRI of the brain will be obtained in the morning with care being signed out to oncoming provider.  Final Clinical Impression(s) / ED Diagnoses Final diagnoses:  None    Rx / DC Orders ED Discharge Orders     None         Geoffery Lyons, MD 01/30/23 [phone]

## 2023-01-30 NOTE — ED Triage Notes (Signed)
Pt arrived via POV c/o left side numbness in his left arm and neck that began when he woke up this morning at 150 today. Pt reports taking a Goody Powder PTA with only relief to his Headache.

## 2023-01-30 NOTE — Evaluation (Signed)
Speech Language Pathology Evaluation Patient Details Name: Jermaine Shaw MRN: [id number] DOB: 1970-09-16 Today's Date: 01/30/2023 Time: 7829-5621 SLP Time Calculation (min) (ACUTE ONLY): 27 min  Problem List:  Patient Active Problem List   Diagnosis Date Noted   CVA (cerebral vascular accident) (HCC) 01/30/2023   Past Medical History: History reviewed. No pertinent past medical history. Past Surgical History:  Past Surgical History:  Procedure Laterality Date   BACK SURGERY     HPI:  Jermaine Shaw is a 52 y.o. male with no significant past medical history who presented with left-sided numbness.  Patient states he went to bed last night around 11 PM.  He woke up around 1:15 AM and felt like his left side of the body fell asleep.  He tried to get up and walk but the sensation persisted.  Therefore he eventually came to the emergency room.  On arrival his blood pressure was 195/90.  He was admitted for stroke workup.  MRI brain was obtained this morning and showed: Positive for a small lacunar infarct of the dorsal right pons,Hyperacute by MRI. No hemorrhage or mass effect. Advanced underlying signal changes in the brain most compatible with chronic small vessel disease. Late subacute appearance of a left corona radiata infarct.Occasional chronic micro hemorrhages in the brain. SLE requested.   Assessment / Plan / Recommendation Clinical Impression  Cognitive linguistic evaluation completed via [facility] (19/30) with deficits noted in auditory comprehension (benefits from repetition), processing speed, working memory, and attention. Pt appeared to be aware of his current deficits, as he stated, "I should be able to answer these questions and I can't". He was able to verbalize moderately complex thoughts and complete two step directions with allowance for extra time. Pt was also sleeping soundly upon SLP arrival, so fatigue may be playing a role. No family is currently present. Oral motor  evaluation was notable for very mild left labial weakness. 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 Recommend f/u SLP services via outpatient services pending PT evaluation.     SLP Assessment  SLP Recommendation/Assessment: Patient needs continued Speech Lanaguage Pathology Services SLP Visit Diagnosis: Cognitive communication deficit (R41.841)    Recommendations for follow up therapy are one component of a multi-disciplinary discharge planning process, led by the attending physician.  Recommendations may be updated based on patient status, additional functional criteria and insurance authorization.    Follow Up Recommendations  Outpatient SLP    Assistance Recommended at Discharge  PRN  Functional Status Assessment Patient has had a recent decline in their functional status and demonstrates the ability to make significant improvements in function in a reasonable and predictable amount of time.  Frequency and Duration min 2x/week  1 week      SLP Evaluation Cognition  Overall Cognitive Status: Impaired/Different from baseline Arousal/Alertness: Awake/alert Orientation Level: Oriented X4 Year: 2024 Month: May Day of Week: Correct Attention: Sustained Sustained Attention: Impaired Sustained Attention Impairment: Verbal complex;Functional complex Memory: Impaired Memory Impairment: Storage deficit;Retrieval deficit Awareness: Appears intact Problem Solving: Appears intact Executive Function: Self Monitoring;Organizing Organizing: Impaired Organizing Impairment: Verbal complex;Functional complex Self Monitoring: Impaired Self Monitoring Impairment: Verbal complex Safety/Judgment: Appears intact       Comprehension  Auditory Comprehension Overall Auditory Comprehension: Impaired Yes/No Questions: Within Functional Limits Commands: Within Functional Limits Conversation: Complex Interfering Components: Attention;Working memory;Processing  speed EffectiveTechniques: Dietitian: Within Function Limits Reading Comprehension Reading Status: Not tested    Expression Expression  Primary Mode of Expression: Verbal Verbal Expression Overall Verbal Expression: Appears within functional limits for tasks assessed Initiation: No impairment Automatic Speech: Name;Social Response Level of Generative/Spontaneous Verbalization: Conversation Repetition: No impairment Naming: No impairment Pragmatics: No impairment Interfering Components: Attention;Speech intelligibility Non-Verbal Means of Communication: Not applicable Written Expression Dominant Hand: Right Written Expression: Not tested   Oral / Motor  Oral Motor/Sensory Function Overall Oral Motor/Sensory Function: Within functional limits Motor Speech Overall Motor Speech: Appears within functional limits for tasks assessed Respiration: Within functional limits Phonation: Normal Resonance: Within functional limits Articulation: Within functional limitis Intelligibility: Intelligibility reduced Word: 75-100% accurate Phrase: 75-100% accurate Sentence: 75-100% accurate Motor Planning: Witnin functional limits           Thank you,  Havery Moros, CCC-SLP [phone]  Jermaine Shaw 01/30/2023, 4:20 PM

## 2023-01-30 NOTE — ED Notes (Signed)
MRI called at this time will come get pt.

## 2023-01-30 NOTE — ED Provider Notes (Signed)
  Physical Exam  BP (!) 153/84   Pulse 65   Temp 98.4 F (36.9 C) (Oral)   Resp 15   Ht 5' 10.5" (1.791 m)   Wt 72.6 kg   SpO2 99%   BMI 22.63 kg/m   Physical Exam Vitals and nursing note reviewed.  Constitutional:      General: He is not in acute distress.    Appearance: He is well-developed.  HENT:     Head: Normocephalic and atraumatic.  Eyes:     Conjunctiva/sclera: Conjunctivae normal.  Cardiovascular:     Rate and Rhythm: Normal rate and regular rhythm.     Heart sounds: No murmur heard. Pulmonary:     Effort: Pulmonary effort is normal. No respiratory distress.  Musculoskeletal:        General: No swelling.     Cervical back: Neck supple.  Skin:    General: Skin is warm and dry.  Neurological:     Mental Status: He is alert.     Sensory: Sensory deficit present.  Psychiatric:        Mood and Affect: Mood normal.     Procedures  Procedures  ED Course / MDM    Medical Decision Making Amount and/or Complexity of Data Reviewed Labs: ordered. Radiology: ordered.  Risk Decision regarding hospitalization.   Patient received in handoff.  Left upper extremity numbness and concern for CVA versus radiculopathy.  Pending MRI imaging at time of signout.  MRI with new right brainstem stroke and a subacute left stroke.  Patient is outside the window for TNK and I spoke with Dr. Amada Jupiter of neurology who is recommending medical admission at [facility] and routine neurological consultation while inpatient.  Patient then admitted       Glendora Score, MD 01/30/23 [phone]

## 2023-01-30 NOTE — ED Notes (Signed)
Patient transported to MRI 

## 2023-01-30 NOTE — Progress Notes (Signed)
  Echocardiogram 2D Echocardiogram has been performed.  Maren Reamer 01/30/2023, 3:03 PM

## 2023-01-30 NOTE — Consult Note (Signed)
I connected with  Jermaine Shaw on 01/30/23 by a video enabled telemedicine application and verified that I am speaking with the correct person using two identifiers.   I discussed the limitations of evaluation and management by telemedicine. The patient expressed understanding and agreed to proceed.  Location of patient: [facility] Location of physician: [facility]   Neurology Consultation Reason for Consult: stroke Referring Physician: Dr Maurilio Lovely  CC: Left-sided numbness  History is obtained from: Patient, chart review  HPI: Jermaine Shaw is a 52 y.o. male with no significant past medical history who presented with left-sided numbness.  Patient states he went to bed last night around 11 PM.  He woke up around 1:15 AM and felt like his left side of the body fell asleep.  He tried to get up and walk but the sensation persisted.  Therefore he eventually came to the emergency room.  On arrival his blood pressure was 195/90.  He was admitted for stroke workup.  MRI brain was obtained this morning and neurology was consulted.  Last known normal: 01/29/2023 11pm Event happened at home No tPA as outside window No thrombectomy as no large vessel occlusion mRS 0  ROS: All other systems reviewed and negative except as noted in the HPI.   Social History:  reports that he has been smoking cigarettes. He has been smoking an average of 1 pack per day. He does not have any smokeless tobacco history on file. He reports current alcohol use. He reports that he does not use drugs.   Medications Prior to Admission  Medication Sig Dispense Refill Last Dose   Aspirin-Acetaminophen-Caffeine (GOODY HEADACHE PO) Take 1 Package by mouth daily as needed (headache).   01/30/2023      Exam: Current vital signs: BP (!) 195/90 (BP Location: Left Arm)   Pulse 80   Temp 98.6 F (37 C) (Oral)   Resp 18   Ht 5\' 8"  (1.727 m)   Wt 71.2 kg   SpO2 100%   BMI 23.87 kg/m  Vital signs  in last 24 hours: Temp:  [98.3 F (36.8 C)-98.6 F (37 C)] 98.6 F (37 C) (05/20 1042) Pulse Rate:  [65-102] 80 (05/20 1042) Resp:  [13-22] 18 (05/20 1042) BP: (153-195)/(81-95) 195/90 (05/20 1042) SpO2:  [95 %-100 %] 100 % (05/20 1042) Weight:  [71.2 kg-72.6 kg] 71.2 kg (05/20 1039)   Physical Exam  Constitutional: Appears well-developed and well-nourished.  Neuro: AOx3, no aphasia, left facial droop, rest of the cranial nerves appear grossly intact, antigravity strength without drift in upper extremities, FTN intact bilaterally, decree sensation to light touch in left face arm and leg  NIHSS  2  INPUTS: 1A: Level of consciousness --> 0 = Alert; keenly responsive 1B: Ask month and age --> 0 = Both questions right 1C: 'Blink eyes' & 'squeeze hands' --> 0 = Performs both tasks 2: Horizontal extraocular movements --> 0 = Normal 3: Visual fields --> 0 = No visual loss 4: Facial palsy --> 1 = Minor paralysis (flat nasolabial fold, smile asymmetry) 5A: Left arm motor drift --> 0 = No drift for 10 seconds 5B: Right arm motor drift --> 0 = No drift for 10 seconds 6A: Left leg motor drift --> 0 = No drift for 5 seconds 6B: Right leg motor drift --> 0 = No drift for 5 seconds 7: Limb Ataxia --> 0 = No ataxia 8: Sensation --> 1 = Mild-moderate loss: can sense being touched  9:  Language/aphasia --> 0 = Normal; no aphasia 10: Dysarthria --> 0 = Normal 11: Extinction/inattention --> 0 = No abnormality   I have reviewed labs in epic and the results pertinent to this consultation are: CBC:  Recent Labs  Lab 01/30/23 0315 01/30/23 0948  WBC 6.7 11.4*  NEUTROABS 2.8  --   HGB 14.2 13.9  HCT 42.6 41.0  MCV 95.1 93.8  PLT 221 219    Basic Metabolic Panel:  Lab Results  Component Value Date   NA 141 01/30/2023   K 3.6 01/30/2023   CO2 23 01/30/2023   GLUCOSE 140 (H) 01/30/2023   BUN 11 01/30/2023   CREATININE 0.89 01/30/2023   CALCIUM 9.0 01/30/2023   GFRNONAA >60 01/30/2023    GFRAA >90 09/25/2013   Lipid Panel: No results found for: "LDLCALC" HgbA1c:  Lab Results  Component Value Date   HGBA1C 6.2 (H) 01/30/2023   Urine Drug Screen: No results found for: "LABOPIA", "COCAINSCRNUR", "LABBENZ", "AMPHETMU", "THCU", "LABBARB"  Alcohol Level No results found for: "ETH"   I have reviewed the images obtained  MRI brain without contrast 01/30/2023: Positive for a small lacunar infarct of the dorsal right pons,Hyperacute by MRI. No hemorrhage or mass effect. Advanced underlying signal changes in the brain most compatible with chronic small vessel disease. Late subacute appearance of a left corona radiata infarct.Occasional chronic micro hemorrhages in the brain.  MRA head without contrast 01/30/2023: Negative for large vessel occlusion. Evidence of mild intracranial atherosclerosis. Positive for distal right ICA saccular 3-4 mm lesion which could be an infundibulum or small aneurysm. Recommend Neuro-Endovascular follow-up to evaluate the appropriateness of potential treatment.  MRI C-spine without contrast 01/30/2023: Cervical disc and endplate degeneration with mild spinal stenosis and spinal cord mass effect at C3-C4 and C5-C6. No spinal cord signal abnormality identified. Associated moderate to severe neural foraminal stenosis at the left C4 and bilateral C6 nerve levels. Up to moderate right C7 foraminal stenosis.    ASSESSMENT/PLAN: 52 year old male presented with left-sided numbness, MRI brain showed strokes as described above.  Acute ischemic stroke -Etiology: Suspected small vessel disease versus less likely cardioembolic  Recommendations: - Recommend aspirin 81 mg daily and Plavix 75 mg daily for 3 weeks followed by aspirin 81 mg daily -Recommend atorvastatin 40 mg daily if LDL more than 70 -Recommend 30-day event monitor to look for A-fib -Goal blood pressure: Permissive hypertension for 48 hours followed by normotension -Stroke education including  BEFAST -Smoking cessation counseling -Follow-up with neurology (order placed) in 4 weeks -Discussed plan with Dr. Sherryll Burger via secure chat   Thank you for allowing [place] to participate in the care of this patient. If you have any further questions, please contact  me or neurohospitalist.   Lindie Spruce [facility]

## 2023-01-30 NOTE — H&P (Addendum)
History and Physical    [name]:[id number] DOB: [birth year] DOA: 01/30/2023  PCP: [facility]   Patient coming from: Home  Chief Complaint: Left upper extremity numbness and weakness with dysarthria  HPI: Jermaine Shaw is a 52 y.o. male with medical history significant for tobacco abuse who presented to the ED with left arm numbness and heaviness.  He woke up at approximately 2 AM with the symptoms and also had a little bit of difficulty with his speech.  He went to bed at approximately 11 PM with no symptoms noted at that time.  He states that he also woke up with some mild headache and took a Goody powder prior to coming here.  He denies any specific aggravating or alleviating factors and states that his headache has resolved.  He denies taking any other home medications for any reason and has no other diagnoses.  He smokes 1 pack/day.   ED Course: Vital signs stable and patient afebrile.  He has undergone CT head and MRI imaging with noted acute stroke in the dorsal right pons region with lacunar infarct.  EDP has discussed with neurology who states that patient can remain at this facility for further evaluation and workup.  Review of Systems: Reviewed as noted above, otherwise negative.  History reviewed. No pertinent past medical history.  Past Surgical History:  Procedure Laterality Date   BACK SURGERY       reports that he has been smoking cigarettes. He has been smoking an average of 1 pack per day. He does not have any smokeless tobacco history on file. He reports current alcohol use. He reports that he does not use drugs.  No Known Allergies  History reviewed. No pertinent family history.  Prior to Admission medications   Medication Sig Start Date End Date Taking? Authorizing Provider  metoCLOPramide (REGLAN) 10 MG tablet Take 1 tablet (10 mg total) by mouth every 6 (six) hours as needed (headache). 05/07/14   Rolland Porter, MD  naproxen (NAPROSYN)  500 MG tablet Take 1 tablet (500 mg total) by mouth 2 (two) times daily as needed for headache. 05/07/14   Rolland Porter, MD    Physical Exam: Vitals:   01/30/23 0400 01/30/23 0500 01/30/23 0552 01/30/23 0630  BP:   (!) 166/84 (!) 153/84  Pulse: 90 80 70 65  Resp: 20 19 (!) 22 15  Temp:   98.4 F (36.9 C)   TempSrc:   Oral   SpO2: 100% 95% 97% 99%  Weight:      Height:        Constitutional: NAD, calm, comfortable Vitals:   01/30/23 0400 01/30/23 0500 01/30/23 0552 01/30/23 0630  BP:   (!) 166/84 (!) 153/84  Pulse: 90 80 70 65  Resp: 20 19 (!) 22 15  Temp:   98.4 F (36.9 C)   TempSrc:   Oral   SpO2: 100% 95% 97% 99%  Weight:      Height:       Eyes: lids and conjunctivae normal Neck: normal, supple Respiratory: clear to auscultation bilaterally. Normal respiratory effort. No accessory muscle use.  Cardiovascular: Regular rate and rhythm, no murmurs. Abdomen: no tenderness, no distention. Bowel sounds positive.  Musculoskeletal:  No edema. Skin: no rashes, lesions, ulcers.  Psychiatric: Flat affect  Labs on Admission: I have personally reviewed following labs and imaging studies  CBC: Recent Labs  Lab 01/30/23 0315  WBC 6.7  NEUTROABS 2.8  HGB 14.2  HCT 42.6  MCV 95.1  PLT 221   Basic Metabolic Panel: Recent Labs  Lab 01/30/23 0315  NA 141  K 3.6  CL 108  CO2 23  GLUCOSE 140*  BUN 11  CREATININE 0.95  CALCIUM 9.0   GFR: Estimated Creatinine Clearance: 91.3 mL/min (by C-G formula based on SCr of 0.95 mg/dL). Liver Function Tests: No results for input(s): "AST", "ALT", "ALKPHOS", "BILITOT", "PROT", "ALBUMIN" in the last 168 hours. No results for input(s): "LIPASE", "AMYLASE" in the last 168 hours. No results for input(s): "AMMONIA" in the last 168 hours. Coagulation Profile: No results for input(s): "INR", "PROTIME" in the last 168 hours. Cardiac Enzymes: No results for input(s): "CKTOTAL", "CKMB", "CKMBINDEX", "TROPONINI" in the last 168  hours. BNP (last 3 results) No results for input(s): "PROBNP" in the last 8760 hours. HbA1C: No results for input(s): "HGBA1C" in the last 72 hours. CBG: No results for input(s): "GLUCAP" in the last 168 hours. Lipid Profile: No results for input(s): "CHOL", "HDL", "LDLCALC", "TRIG", "CHOLHDL", "LDLDIRECT" in the last 72 hours. Thyroid Function Tests: No results for input(s): "TSH", "T4TOTAL", "FREET4", "T3FREE", "THYROIDAB" in the last 72 hours. Anemia Panel: No results for input(s): "VITAMINB12", "FOLATE", "FERRITIN", "TIBC", "IRON", "RETICCTPCT" in the last 72 hours. Urine analysis: No results found for: "COLORURINE", "APPEARANCEUR", "LABSPEC", "PHURINE", "GLUCOSEU", "HGBUR", "BILIRUBINUR", "KETONESUR", "PROTEINUR", "UROBILINOGEN", "NITRITE", "LEUKOCYTESUR"  Radiological Exams on Admission: MR Cervical Spine Wo Contrast  Result Date: 01/30/2023 CLINICAL DATA:  52 year old male with left side numbness upon waking. Neurologic deficit versus radiculopathy. EXAM: MRI CERVICAL SPINE WITHOUT CONTRAST TECHNIQUE: Multiplanar, multisequence MR imaging of the cervical spine was performed. No intravenous contrast was administered. COMPARISON:  Brain MRI today. FINDINGS: Alignment: Straightening of cervical lordosis. No significant spondylolisthesis. Vertebrae: Normal background bone marrow signal. Faint degenerative C5-C6 endplate marrow edema superimposed on endplate spurring, eccentric to the right. See additional details below. Cord: No cervical spinal cord signal abnormality identified despite degenerative spinal cord mass effect, detailed below. Posterior Fossa, vertebral arteries, paraspinal tissues: Cervicomedullary junction is within normal limits. C posterior fossa findings on MRI today reported separately. Preserved major vascular flow voids in the neck. The right vertebral artery appears dominant. Negative visible neck soft tissues and lung apices. Disc levels: C2-C3: Mild to moderate facet  hypertrophy on the left. Mild left C3 foraminal stenosis. C3-C4: Disc space loss. Circumferential disc bulge and endplate spurring with broad-based posterior component. Mild spinal stenosis and spinal cord mass effect (series 12, image 8). Moderate to severe left and mild right C4 foraminal stenosis. C4-C5: Mild disc bulging and endplate spurring. No significant stenosis. C5-C6: Disc space loss with circumferential disc bulge and endplate spurring eccentric to the right. Broad-based posterior component with mild spinal stenosis and spinal cord mass effect (series 12 image 18). Moderate left, moderate to severe right C6 foraminal stenosis. C6-C7: Disc bulging. Mild endplate spurring. Mild to moderate right C7 neural foraminal stenosis. No significant spinal stenosis. C7-T1: Mild facet hypertrophy and endplate spurring greater on the right. No significant stenosis. Negative visible upper thoracic spinal canal. IMPRESSION: 1. 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 No spinal cord signal abnormality identified. 2. Associated moderate to severe neural foraminal stenosis at the left C4 and bilateral C6 nerve levels. Up to moderate right C7 foraminal stenosis. Electronically Signed   By: Odessa Fleming M.D.   On: 01/30/2023 08:55   MR ANGIO HEAD WO CONTRAST  Result Date: 01/30/2023 CLINICAL DATA:  52 year old male with  left side numbness upon waking. Small vessel disease in the brain with hyperacute MRI appearance of dorsal brainstem lacune. EXAM: MRA HEAD WITHOUT CONTRAST TECHNIQUE: Angiographic images of the Circle of Willis were acquired using MRA technique without intravenous contrast. COMPARISON:  MRI 0728 hours today reported separately. FINDINGS: Anterior circulation: Antegrade flow in both ICA siphons. Mild bilateral siphon irregularity. No siphon stenosis. However, there is a small 3-4 mm inferiorly directed distal right ICA siphon infundibulum  versus aneurysm. See series 5, image 76 and series 1038, image 4. Patent carotid termini. Normal MCA and ACA origins. Anterior communicating artery might be fenestrated, normal variant. Proximal A2 segments also mildly tortuous. No convincing anterior communicating artery aneurysm. Visible ACA branches otherwise within normal limits. Bilateral MCA M1 segments and MCA bifurcations appear patent without stenosis. It appears the left anterior temporal artery arises directly from the ICA terminus (normal variant). Other visible bilateral MCA branches are within normal limits. Posterior circulation: Antegrade flow in the posterior circulation. Dominant appearing distal right vertebral artery. Both PICA origins are patent. No distal vertebral or basilar artery stenosis is identified. Mildly irregular basilar artery. SCA and PCA origins remain normal. Posterior communicating arteries are diminutive or absent. Bilateral PCA branches are within normal limits. Anatomic variants: Dominant right vertebral artery. Left anterior temporal artery arises directly from the ICA. Possible fenestrated anterior communicating artery. Other: Brain MRI today reported separately. IMPRESSION: 1. Negative for large vessel occlusion. Evidence of mild intracranial atherosclerosis. 2. Positive for distal right ICA saccular 3-4 mm lesion which could be an infundibulum or small aneurysm. Recommend Neuro-Endovascular follow-up to evaluate the appropriateness of potential treatment. MRI Brain findings and preliminary report of this MRA (no large vessel occlusion) were discussed by telephone with Dr. Glendora Score on 01/30/2023 at 0843 hours today. Electronically Signed   By: Odessa Fleming M.D.   On: 01/30/2023 08:52   MR BRAIN WO CONTRAST  Result Date: 01/30/2023 CLINICAL DATA:  52 year old male with left side numbness upon waking. EXAM: MRI HEAD WITHOUT CONTRAST TECHNIQUE: Multiplanar, multiecho pulse sequences of the brain and surrounding structures  were obtained without intravenous contrast. COMPARISON:  Head CT 0328 hours today. FINDINGS: Brain: Small linear dorsal right brainstem focus of restricted diffusion (series 5, image 9). Minimal associated FLAIR hyperintensity series 12, image 7. No evidence of associated hemorrhage or mass effect. Abnormal diffusion along the margins of an otherwise chronic appearing lacunar infarct of the left corona radiata, well visible by CT today and with central cystic encephalomalacia. No hemosiderin there. No other abnormal diffusion. But additional patchy and somewhat nodular bilateral periventricular white matter and central white matter T2 and FLAIR hyperintensity in both hemispheres. Chronic microhemorrhage in the posterior left hemisphere series 11, image 12. And evidence of a tiny chronic microhemorrhage in the right cerebellum on image 6. Possible tiny chronic lacunar infarct of the left thalamus. Right deep gray nuclei remain normal. Possible tiny chronic lacunar infarct of the left cerebellum series 13, image 16. No midline shift, mass effect, evidence of mass lesion, ventriculomegaly, extra-axial collection or acute intracranial hemorrhage. Cervicomedullary junction and pituitary are within normal limits. Background brain volume is normal for age. Vascular: Major intracranial vascular flow voids are preserved. The distal right vertebral artery appears dominant and there is a degree of generalized intracranial artery tortuosity. Skull and upper cervical spine: Dedicated cervical spine is reported separately today. Visualized bone marrow signal is within normal limits. Sinuses/Orbits: Orbits appear symmetric and negative. Paranasal Visualized paranasal sinuses and mastoids are stable and  well aerated. Other: Visible internal auditory structures appear normal. Negative visible scalp and face. IMPRESSION: 1. Positive for a small lacunar infarct of the dorsal right pons, Hyperacute by MRI. No hemorrhage or mass effect.  2. Advanced underlying signal changes in the brain most compatible with chronic small vessel disease. Late subacute appearance of a left corona radiata infarct. Occasional chronic micro hemorrhages in the brain. 3. MRA and Cervical MRI reported separately. Electronically Signed   By: Odessa Fleming M.D.   On: 01/30/2023 08:42   CT Head Wo Contrast  Result Date: 01/30/2023 CLINICAL DATA:  51 year old male with left side numbness upon waking. EXAM: CT HEAD WITHOUT CONTRAST TECHNIQUE: Contiguous axial images were obtained from the base of the skull through the vertex without intravenous contrast. RADIATION DOSE REDUCTION: This exam was performed according to the departmental dose-optimization program which includes automated exposure control, adjustment of the mA and/or kV according to patient size and/or use of iterative reconstruction technique. COMPARISON:  Neck CT 09/25/2013. FINDINGS: Brain: Normal cerebral volume. No midline shift, ventriculomegaly, mass effect, evidence of mass lesion, intracranial hemorrhage or evidence of cortically based acute infarction. Advanced for age, scattered bilateral cerebral white matter hypodensity, especially in the right corona radiata (series 2, image 17). Some involvement of the left lentiform on that side. Right hemisphere less affected. Brainstem and cerebellum gray-white differentiation within normal limits. Vascular: No suspicious intracranial vascular hyperdensity. Skull: Smooth and benign-appearing left parietal bony exostosis series 4, image 47. This level is not included on the prior neck CT. No other osseous abnormality identified. Sinuses/Orbits: Visualized paranasal sinuses and mastoids are clear. Other: Visualized orbits and scalp soft tissues are within normal limits. IMPRESSION: 1. Advanced, age indeterminate cerebral white matter changes more pronounced in the left hemisphere. These are nonspecific but most commonly due to small vessel ischemia. 2. No other acute  intracranial abnormality. 3. Benign-appearing left parietal bone exostosis (no follow-up imaging recommended). Electronically Signed   By: Odessa Fleming M.D.   On: 01/30/2023 04:13    EKG: Independently reviewed. SR 78 bpm  Assessment/Plan Principal Problem:   CVA (cerebral vascular accident) (HCC)    Acute ischemic CVA -Noted on MRI with no LVO, outside of window for tPA -Appreciate neurology recommendations -2D echocardiogram -Lipids and hemoglobin A1c -Start on full dose ASA -PT/OT/SLP evaluation -IV normal saline -Permissive hypertension 220/110, hydralazine will be ordered as needed  History of tobacco abuse -Counseled on cessation   DVT prophylaxis: Lovenox Code Status: Full Family Communication:None at bedside  Disposition Plan:Admit for CVA evaluation Consults called:Neurology Admission status: Obs, Tele  Severity of Illness: The appropriate patient status for this patient is OBSERVATION. Observation status is judged to be reasonable and necessary in order to provide the required intensity of service to ensure the patient's safety. The patient's presenting symptoms, physical exam findings, and initial radiographic and laboratory data in the context of their medical condition is felt to place them at decreased risk for further clinical deterioration. Furthermore, it is anticipated that the patient will be medically stable for discharge from the hospital within 2 midnights of admission.    Maryruth Apple D Sherryll Burger DO [facility]  If 7PM-7AM, please contact night-coverage [email or web address]  01/30/2023, 9:23 AM

## 2023-01-31 DIAGNOSIS — I5020 Unspecified systolic (congestive) heart failure: Secondary | ICD-10-CM | POA: Diagnosis not present

## 2023-01-31 DIAGNOSIS — I639 Cerebral infarction, unspecified: Secondary | ICD-10-CM | POA: Diagnosis not present

## 2023-01-31 LAB — LIPID PANEL
Cholesterol: 161 mg/dL (ref 0–200)
HDL: 40 mg/dL — ABNORMAL LOW (ref >40)
LDL Cholesterol: 109 mg/dL — ABNORMAL HIGH (ref 0–99)
Total CHOL/HDL Ratio: 4 RATIO
Triglycerides: 60 mg/dL (ref <150)
VLDL: 12 mg/dL (ref 0–40)

## 2023-01-31 MED ORDER — ATORVASTATIN CALCIUM 40 MG PO TABS
40.0000 mg | ORAL_TABLET | Freq: Every day | ORAL | Status: DC
  Administered 2023-01-31 – 2023-02-01 (×2): 40 mg via ORAL
  Filled 2023-01-31 (×2): qty 1

## 2023-01-31 NOTE — Evaluation (Signed)
Physical Therapy Evaluation Patient Details Name: Jermaine Shaw MRN: [id number] DOB: 1971/03/26 Today's Date: 01/31/2023  History of Present Illness  Jermaine Shaw is a 52 y.o. male with medical history significant for tobacco abuse who presented to the ED with left arm numbness and heaviness.  He woke up at approximately 2 AM with the symptoms and also had a little bit of difficulty with his speech.  He went to bed at approximately 11 PM with no symptoms noted at that time.  He states that he also woke up with some mild headache and took a Goody powder prior to coming here.  He denies any specific aggravating or alleviating factors and states that his headache has resolved.  He denies taking any other home medications for any reason and has no other diagnoses.  He smokes 1 pack/day.   Clinical Impression  Patient has to lean on walls when taking steps without an AD, required use of single point cane [facility]) for support  and able to ambulate in hallway, up/down stairs without loss of balance with understanding acknowledged.  Patient will benefit from continued skilled physical therapy in hospital and recommended venue below to increase strength, balance, endurance for safe ADLs and gait.      Recommendations for follow up therapy are one component of a multi-disciplinary discharge planning process, led by the attending physician.  Recommendations may be updated based on patient status, additional functional criteria and insurance authorization.  Follow Up Recommendations       Assistance Recommended at Discharge Set up Supervision/Assistance  Patient can return home with the following  A little help with walking and/or transfers;Help with stairs or ramp for entrance;Assistance with cooking/housework    Programme researcher, broadcasting/film/video (single point cane)  Recommendations for Other Services       Functional Status Assessment Patient has had a recent decline in their functional status and  demonstrates the ability to make significant improvements in function in a reasonable and predictable amount of time.     Precautions / Restrictions Precautions Precautions: None Restrictions Weight Bearing Restrictions: No      Mobility  Bed Mobility Overal bed mobility: Independent                  Transfers Overall transfer level: Needs assistance Equipment used: Straight cane, None Transfers: Sit to/from Stand, Bed to chair/wheelchair/BSC Sit to Stand: Supervision, Min guard   Step pivot transfers: Supervision, Min guard       General transfer comment: increased time for completing sit to stands, has to lean on nearby objects for support during transfers    Ambulation/Gait Ambulation/Gait assistance: Supervision Gait Distance (Feet): 100 Feet Assistive device: None, Straight cane Gait Pattern/deviations: Decreased step length - right, Decreased step length - left, Decreased stride length, Step-through pattern Gait velocity: decreased     General Gait Details: has to lean on wall, nearby objects for support when walking without AD, decreased LUE arm swing, safer using SPC demonstrating mostly step-through pattern without loss of balance  Stairs Stairs: Yes Stairs assistance: Supervision, Modified independent (Device/Increase time) Stair Management: One rail Right, One rail Left Number of Stairs: 10 General stair comments: demonstrates good return for going up/down steps in stairwell using 1 side rail and cane without loss of balance and understanding acknowledged  Wheelchair Mobility    Modified Rankin (Stroke Patients Only)       Balance Overall balance assessment: Needs assistance Sitting-balance support: Feet supported, No upper extremity supported Sitting balance-Leahy Scale:  Good Sitting balance - Comments: seated at EOB   Standing balance support: During functional activity, No upper extremity supported Standing balance-Leahy Scale:  Poor Standing balance comment: fair/poor without AD, fair/good using cane                             Pertinent Vitals/Pain Pain Assessment Pain Assessment: No/denies pain    Home Living Family/patient expects to be discharged to:: Private residence Living Arrangements: Alone Available Help at Discharge: Family;Other (Comment) Type of Home: House Home Access: Stairs to enter Entrance Stairs-Rails: Doctor, general practice of Steps: 6 total   Home Layout: One level Home Equipment: None      Prior Function Prior Level of Function : Independent/Modified Independent;Driving             Mobility Comments: Community ambulator without AD, drive ADLs Comments: Independent     Hand Dominance   Dominant Hand: Right    Extremity/Trunk Assessment   Upper Extremity Assessment Upper Extremity Assessment: Defer to OT evaluation    Lower Extremity Assessment Lower Extremity Assessment: LLE deficits/detail LLE Deficits / Details: grossly 4+/5 LLE Sensation: decreased proprioception LLE Coordination: WNL    Cervical / Trunk Assessment Cervical / Trunk Assessment: Normal  Communication   Communication: No difficulties  Cognition Arousal/Alertness: Awake/alert Behavior During Therapy: WFL for tasks assessed/performed Overall Cognitive Status: Within Functional Limits for tasks assessed                                          General Comments      Exercises     Assessment/Plan    PT Assessment Patient needs continued PT services  PT Problem List Decreased strength;Decreased activity tolerance;Decreased balance;Decreased mobility;Decreased coordination;Impaired sensation       PT Treatment Interventions DME instruction;Gait training;Stair training;Functional mobility training;Therapeutic activities;Therapeutic exercise;Patient/family education;Balance training    PT Goals (Current goals can be found in the Care Plan section)   Acute Rehab PT Goals Patient Stated Goal: return home with family to assist PT Goal Formulation: With patient Time For Goal Achievement: 02/03/23 Potential to Achieve Goals: Good    Frequency Min 2X/week     Co-evaluation PT/OT/SLP Co-Evaluation/Treatment: Yes Reason for Co-Treatment: To address functional/ADL transfers PT goals addressed during session: Mobility/safety with mobility;Balance;Proper use of DME         AM-PAC PT "6 Clicks" Mobility  Outcome Measure Help needed turning from your back to your side while in a flat bed without using bedrails?: None Help needed moving from lying on your back to sitting on the side of a flat bed without using bedrails?: None Help needed moving to and from a bed to a chair (including a wheelchair)?: A Little Help needed standing up from a chair using your arms (e.g., wheelchair or bedside chair)?: A Little Help needed to walk in hospital room?: A Little Help needed climbing 3-5 steps with a railing? : A Little 6 Click Score: 20    End of Session   Activity Tolerance: Patient tolerated treatment well;Patient limited by fatigue Patient left: in bed;with call bell/phone within reach Nurse Communication: Mobility status PT Visit Diagnosis: Unsteadiness on feet (R26.81);Other abnormalities of gait and mobility (R26.89);Muscle weakness (generalized) (M62.81)    Time: 4098-1191 PT Time Calculation (min) (ACUTE ONLY): 21 min   Charges:   PT Evaluation $PT Eval Moderate Complexity: 1  Mod PT Treatments $Therapeutic Activity: 8-22 mins        1:57 PM, 01/31/23 Ocie Bob, MPT Physical Therapist with [facility] [phone] office [phone] mobile phone

## 2023-01-31 NOTE — Progress Notes (Signed)
PROGRESS NOTE    Jermaine Shaw  ZOX:[id number] DOB: [birth year] DOA: 01/30/2023 PCP: [facility]   Brief Narrative:    Jermaine Shaw is a 52 y.o. male with medical history significant for tobacco abuse who presented to the ED with left arm numbness and heaviness.  Patient was admitted with acute ischemic CVA and neurology has placed recommendations for discharge.  Unfortunately, 2D echocardiogram is now showing newly diagnosed severe LV dysfunction and cardiology is following with recommendations pending.  Assessment & Plan:   Principal Problem:   CVA (cerebral vascular accident) (HCC)  Assessment and Plan:   Acute ischemic CVA -Cardiology recommending aspirin 81 mg daily and Plavix 75 mg daily for 3 weeks followed by aspirin 81 mg daily -Atorvastatin started for LDL 109 -Hemoglobin A1c 6.2% -Recommending 30-day event monitor to look for A-fib on discharge -Counseled on smoking cessation -Follow-up with neurology Dr. Pearlean Brownie arranged per Neurology -No further recommendations from PT/OT -Blood pressure management per cardiology  Severe LV dysfunction -2D echocardiogram with LVEF 30-35% -Appreciate cardiology recommendations for further evaluation and management   History of tobacco abuse -Counseled on cessation    DVT prophylaxis:Lovenox Code Status: Full Family Communication: None at bedside Disposition Plan:  Status is: Observation The patient remains OBS appropriate and will d/c before 2 midnights.   Consultants:  Neurology Cardiology  Procedures:  None  Antimicrobials:  None   Subjective: Patient seen and evaluated today with no new acute complaints or concerns. No acute concerns or events noted overnight.  Objective: Vitals:   01/30/23 1456 01/30/23 2007 01/30/23 2349 01/31/23 0407  BP: (!) 142/89 (!) 161/97 (!) 163/81 (!) 173/86  Pulse: 79 72 71 79  Resp: 18 16 18 20   Temp: 98.5 F (36.9 C) 98.6 F (37 C) 98.3 F (36.8 C) 98.9  F (37.2 C)  TempSrc: Oral Oral Oral Oral  SpO2: 97% 98% 100% 98%  Weight:      Height:        Intake/Output Summary (Last 24 hours) at 01/31/2023 1635 Last data filed at 01/30/2023 1700 Gross per 24 hour  Intake 240 ml  Output --  Net 240 ml   Filed Weights   01/30/23 0251 01/30/23 1039  Weight: 72.6 kg 71.2 kg    Examination:  General exam: Appears calm and comfortable  Respiratory system: Clear to auscultation. Respiratory effort normal. Cardiovascular system: S1 & S2 heard, RRR.  Gastrointestinal system: Abdomen is soft Central nervous system: Alert and awake Extremities: No edema Skin: No significant lesions noted Psychiatry: Flat affect.    Data Reviewed: I have personally reviewed following labs and imaging studies  CBC: Recent Labs  Lab 01/30/23 0315 01/30/23 0948  WBC 6.7 11.4*  NEUTROABS 2.8  --   HGB 14.2 13.9  HCT 42.6 41.0  MCV 95.1 93.8  PLT 221 219   Basic Metabolic Panel: Recent Labs  Lab 01/30/23 0315 01/30/23 0948  NA 141  --   K 3.6  --   CL 108  --   CO2 23  --   GLUCOSE 140*  --   BUN 11  --   CREATININE 0.95 0.89  CALCIUM 9.0  --    GFR: Estimated Creatinine Clearance: 91.8 mL/min (by C-G formula based on SCr of 0.89 mg/dL). Liver Function Tests: No results for input(s): "AST", "ALT", "ALKPHOS", "BILITOT", "PROT", "ALBUMIN" in the last 168 hours. No results for input(s): "LIPASE", "AMYLASE" in the last 168 hours. No results for input(s): "AMMONIA" in the last  168 hours. Coagulation Profile: No results for input(s): "INR", "PROTIME" in the last 168 hours. Cardiac Enzymes: No results for input(s): "CKTOTAL", "CKMB", "CKMBINDEX", "TROPONINI" in the last 168 hours. BNP (last 3 results) No results for input(s): "PROBNP" in the last 8760 hours. HbA1C: Recent Labs    01/30/23 0948  HGBA1C 6.2*   CBG: No results for input(s): "GLUCAP" in the last 168 hours. Lipid Profile: Recent Labs    01/31/23 0417  CHOL 161  HDL 40*   LDLCALC 109*  TRIG 60  CHOLHDL 4.0   Thyroid Function Tests: No results for input(s): "TSH", "T4TOTAL", "FREET4", "T3FREE", "THYROIDAB" in the last 72 hours. Anemia Panel: No results for input(s): "VITAMINB12", "FOLATE", "FERRITIN", "TIBC", "IRON", "RETICCTPCT" in the last 72 hours. Sepsis Labs: No results for input(s): "PROCALCITON", "LATICACIDVEN" in the last 168 hours.  No results found for this or any previous visit (from the past 240 hour(s)).       Radiology Studies: ECHOCARDIOGRAM COMPLETE  Result Date: 01/30/2023    ECHOCARDIOGRAM REPORT   Patient Name:   Jermaine Shaw Date of Exam: 01/30/2023 Medical Rec #:  [id number]       Height:       68.0 in Accession #:    [id number]      Weight:       157.0 lb Date of Birth:  Feb 03, 1971       BSA:          1.844 m Patient Age:    52 years        BP:           142/89 mmHg Patient Gender: M               HR:           68 bpm. [facility] Procedure: 2D Echo, Color Doppler, Cardiac Doppler and Intracardiac            Opacification Agent Indications:    Stroke I63.9  History:        Patient has no prior history of Echocardiogram examinations.                 CVA; Risk Factors:Current Smoker.  Sonographer:    Aron Baba Referring Phys: [id number] Starkisha Tullis [facility] IMPRESSIONS  1. Left ventricular ejection fraction, by estimation, is 30 to 35%. The left ventricle has moderately decreased function. The left ventricle demonstrates global hypokinesis. Left ventricular diastolic parameters are consistent with Grade I diastolic dysfunction (impaired relaxation).  2. Right ventricular systolic function is normal. The right ventricular size is normal. There is normal pulmonary artery systolic pressure.  3. The mitral valve is normal in structure. Mild mitral valve regurgitation. No evidence of mitral stenosis.  4. The tricuspid valve is abnormal.  5. The aortic valve is tricuspid. Aortic valve regurgitation is not visualized. No aortic stenosis is  present.  6. The inferior vena cava is normal in size with greater than 50% respiratory variability, suggesting right atrial pressure of 3 mmHg. FINDINGS  Left Ventricle: Left ventricular ejection fraction, by estimation, is 30 to 35%. The left ventricle has moderately decreased function. The left ventricle demonstrates global hypokinesis. Definity contrast agent was given IV to delineate the left ventricular endocardial borders. The left ventricular internal cavity size was normal in size. There is no left ventricular hypertrophy. Left ventricular diastolic parameters are consistent with Grade I diastolic dysfunction (impaired relaxation). Normal  left ventricular filling pressure. Right Ventricle: The right ventricular size  is normal. Right vetricular wall thickness was not well visualized. Right ventricular systolic function is normal. There is normal pulmonary artery systolic pressure. The tricuspid regurgitant velocity is 2.35 m/s, and with an assumed right atrial pressure of 3 mmHg, the estimated right ventricular systolic pressure is 25.1 mmHg. Left Atrium: Left atrial size was normal in size. Right Atrium: Right atrial size was normal in size. Pericardium: There is no evidence of pericardial effusion. Mitral Valve: The mitral valve is normal in structure. Mild mitral valve regurgitation. No evidence of mitral valve stenosis. Tricuspid Valve: The tricuspid valve is abnormal. Tricuspid valve regurgitation is mild . No evidence of tricuspid stenosis. Aortic Valve: The aortic valve is tricuspid. Aortic valve regurgitation is not visualized. No aortic stenosis is present. Aortic valve mean gradient measures 2.5 mmHg. Aortic valve peak gradient measures 1.4 mmHg. Aortic valve area, by VTI measures 2.28 cm. Pulmonic Valve: The pulmonic valve was not well visualized. Pulmonic valve regurgitation is not visualized. No evidence of pulmonic stenosis. Aorta: The aortic root is normal in size and structure. Venous: The  inferior vena cava is normal in size with greater than 50% respiratory variability, suggesting right atrial pressure of 3 mmHg. IAS/Shunts: The interatrial septum was not well visualized.  LEFT VENTRICLE PLAX 2D LVIDd:         5.30 cm   Diastology LVIDs:         4.60 cm   LV e' medial:    4.37 cm/s LV PW:         1.00 cm   LV E/e' medial:  17.0 LV IVS:        0.90 cm   LV e' lateral:   7.49 cm/s LVOT diam:     2.10 cm   LV E/e' lateral: 9.9 LV SV:         46 LV SV Index:   25 LVOT Area:     3.46 cm  RIGHT VENTRICLE RV S prime:     8.76 cm/s TAPSE (M-mode): 1.6 cm LEFT ATRIUM           Index        RIGHT ATRIUM           Index LA diam:      2.50 cm 1.36 cm/m   RA Area:     16.20 cm LA Vol (A2C): 42.3 ml 22.94 ml/m  RA Volume:   44.00 ml  23.86 ml/m LA Vol (A4C): 31.8 ml 17.25 ml/m  AORTIC VALVE AV Area (Vmax):    3.99 cm AV Area (Vmean):   1.89 cm AV Area (VTI):     2.28 cm AV Vmax:           58.11 cm/s AV Vmean:          74.401 cm/s AV VTI:            0.202 m AV Peak Grad:      1.4 mmHg AV Mean Grad:      2.5 mmHg LVOT Vmax:         67.00 cm/s LVOT Vmean:        40.700 cm/s LVOT VTI:          0.133 m LVOT/AV VTI ratio: 0.66  AORTA Ao Root diam: 3.90 cm Ao Asc diam:  3.10 cm MITRAL VALVE                TRICUSPID VALVE MV Area (PHT): 3.63 cm     TR Peak grad:  22.1 mmHg MV Decel Time: 209 msec     TR Vmax:        235.00 cm/s MR Peak grad: 73.8 mmHg MR Vmax:      429.50 cm/s   SHUNTS MV E velocity: 74.10 cm/s   Systemic VTI:  0.13 m MV A velocity: 111.00 cm/s  Systemic Diam: 2.10 cm MV E/A ratio:  0.67 Dina Rich MD Electronically signed by Dina Rich MD Signature Date/Time: 01/30/2023/3:32:53 PM    Final    US Carotid Bilateral  Result Date: 01/30/2023 CLINICAL DATA:  Stroke, left arm numbness EXAM: BILATERAL CAROTID DUPLEX ULTRASOUND TECHNIQUE: Wallace Cullens scale imaging, color Doppler and duplex ultrasound were performed of bilateral carotid and vertebral arteries in the neck. COMPARISON:  CT  09/25/2013 FINDINGS: Criteria: Quantification of carotid stenosis is based on velocity parameters that correlate the residual internal carotid diameter with NASCET-based stenosis levels, using the diameter of the distal internal carotid lumen as the denominator for stenosis measurement. The following velocity measurements were obtained: RIGHT ICA: 126/40 cm/sec CCA: 82/13 cm/sec SYSTOLIC ICA/CCA RATIO:  1.5 ECA: 72 cm/sec LEFT ICA: 80/20 cm/sec CCA: 73/20 cm/sec SYSTOLIC ICA/CCA RATIO:  1.1 ECA: 63 cm/sec RIGHT CAROTID ARTERY: Mild smooth plaque in the distal common carotid artery without significant stenosis. Normal waveforms and color Doppler signal throughout. RIGHT VERTEBRAL ARTERY:  Normal flow direction and waveform. LEFT CAROTID ARTERY: Partially calcified plaque in the proximal left ECA resulting in only mild short-segment stenosis. Normal waveforms and color Doppler signal throughout. LEFT VERTEBRAL ARTERY:  Normal flow direction and waveform. IMPRESSION: 1. Bilateral carotid bifurcation plaque resulting in less than 50% diameter ICA stenosis. 2. Antegrade bilateral vertebral arterial flow. Electronically Signed   By: Corlis Leak M.D.   On: 01/30/2023 14:21   MR Cervical Spine Wo Contrast  Result Date: 01/30/2023 CLINICAL DATA:  52 year old male with left side numbness upon waking. Neurologic deficit versus radiculopathy. EXAM: MRI CERVICAL SPINE WITHOUT CONTRAST TECHNIQUE: Multiplanar, multisequence MR imaging of the cervical spine was performed. No intravenous contrast was administered. COMPARISON:  Brain MRI today. FINDINGS: Alignment: Straightening of cervical lordosis. No significant spondylolisthesis. Vertebrae: Normal background bone marrow signal. Faint degenerative C5-C6 endplate marrow edema superimposed on endplate spurring, eccentric to the right. See additional details below. Cord: No cervical spinal cord signal abnormality identified despite degenerative spinal cord mass effect, detailed  below. Posterior Fossa, vertebral arteries, paraspinal tissues: Cervicomedullary junction is within normal limits. C posterior fossa findings on MRI today reported separately. Preserved major vascular flow voids in the neck. The right vertebral artery appears dominant. Negative visible neck soft tissues and lung apices. Disc levels: C2-C3: Mild to moderate facet hypertrophy on the left. Mild left C3 foraminal stenosis. C3-C4: Disc space loss. Circumferential disc bulge and endplate spurring with broad-based posterior component. Mild spinal stenosis and spinal cord mass effect (series 12, image 8). Moderate to severe left and mild right C4 foraminal stenosis. C4-C5: Mild disc bulging and endplate spurring. No significant stenosis. C5-C6: Disc space loss with circumferential disc bulge and endplate spurring eccentric to the right. Broad-based posterior component with mild spinal stenosis and spinal cord mass effect (series 12 image 18). Moderate left, moderate to severe right C6 foraminal stenosis. C6-C7: Disc bulging. Mild endplate spurring. Mild to moderate right C7 neural foraminal stenosis. No significant spinal stenosis. C7-T1: Mild facet hypertrophy and endplate spurring greater on the right. No significant stenosis. Negative visible upper thoracic spinal canal. IMPRESSION: 1. 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 No spinal cord signal abnormality identified. 2. Associated moderate to severe neural foraminal stenosis at the left C4 and bilateral C6 nerve levels. Up to moderate right C7 foraminal stenosis. Electronically Signed   By: Odessa Fleming M.D.   On: 01/30/2023 08:55   MR ANGIO HEAD WO CONTRAST  Result Date: 01/30/2023 CLINICAL DATA:  52 year old male with left side numbness upon waking. Small vessel disease in the brain with hyperacute MRI appearance of dorsal brainstem lacune. EXAM: MRA HEAD WITHOUT CONTRAST TECHNIQUE:  Angiographic images of the Circle of Willis were acquired using MRA technique without intravenous contrast. COMPARISON:  MRI 0728 hours today reported separately. FINDINGS: Anterior circulation: Antegrade flow in both ICA siphons. Mild bilateral siphon irregularity. No siphon stenosis. However, there is a small 3-4 mm inferiorly directed distal right ICA siphon infundibulum versus aneurysm. See series 5, image 76 and series 1038, image 4. Patent carotid termini. Normal MCA and ACA origins. Anterior communicating artery might be fenestrated, normal variant. Proximal A2 segments also mildly tortuous. No convincing anterior communicating artery aneurysm. Visible ACA branches otherwise within normal limits. Bilateral MCA M1 segments and MCA bifurcations appear patent without stenosis. It appears the left anterior temporal artery arises directly from the ICA terminus (normal variant). Other visible bilateral MCA branches are within normal limits. Posterior circulation: Antegrade flow in the posterior circulation. Dominant appearing distal right vertebral artery. Both PICA origins are patent. No distal vertebral or basilar artery stenosis is identified. Mildly irregular basilar artery. SCA and PCA origins remain normal. Posterior communicating arteries are diminutive or absent. Bilateral PCA branches are within normal limits. Anatomic variants: Dominant right vertebral artery. Left anterior temporal artery arises directly from the ICA. Possible fenestrated anterior communicating artery. Other: Brain MRI today reported separately. IMPRESSION: 1. Negative for large vessel occlusion. Evidence of mild intracranial atherosclerosis. 2. Positive for distal right ICA saccular 3-4 mm lesion which could be an infundibulum or small aneurysm. Recommend Neuro-Endovascular follow-up to evaluate the appropriateness of potential treatment. MRI Brain findings and preliminary report of this MRA (no large vessel occlusion) were discussed by  telephone with Dr. Glendora Score on 01/30/2023 at 0843 hours today. Electronically Signed   By: Odessa Fleming M.D.   On: 01/30/2023 08:52   MR BRAIN WO CONTRAST  Result Date: 01/30/2023 CLINICAL DATA:  52 year old male with left side numbness upon waking. EXAM: MRI HEAD WITHOUT CONTRAST TECHNIQUE: Multiplanar, multiecho pulse sequences of the brain and surrounding structures were obtained without intravenous contrast. COMPARISON:  Head CT 0328 hours today. FINDINGS: Brain: Small linear dorsal right brainstem focus of restricted diffusion (series 5, image 9). Minimal associated FLAIR hyperintensity series 12, image 7. No evidence of associated hemorrhage or mass effect. Abnormal diffusion along the margins of an otherwise chronic appearing lacunar infarct of the left corona radiata, well visible by CT today and with central cystic encephalomalacia. No hemosiderin there. No other abnormal diffusion. But additional patchy and somewhat nodular bilateral periventricular white matter and central white matter T2 and FLAIR hyperintensity in both hemispheres. Chronic microhemorrhage in the posterior left hemisphere series 11, image 12. And evidence of a tiny chronic microhemorrhage in the right cerebellum on image 6. Possible tiny chronic lacunar infarct of the left thalamus. Right deep gray nuclei remain normal. Possible tiny chronic lacunar infarct of the left cerebellum series 13, image 16. No midline shift, mass effect, evidence of mass lesion, ventriculomegaly, extra-axial collection or acute intracranial hemorrhage. Cervicomedullary junction and pituitary are within normal  limits. Background brain volume is normal for age. Vascular: Major intracranial vascular flow voids are preserved. The distal right vertebral artery appears dominant and there is a degree of generalized intracranial artery tortuosity. Skull and upper cervical spine: Dedicated cervical spine is reported separately today. Visualized bone marrow signal is  within normal limits. Sinuses/Orbits: Orbits appear symmetric and negative. Paranasal Visualized paranasal sinuses and mastoids are stable and well aerated. Other: Visible internal auditory structures appear normal. Negative visible scalp and face. IMPRESSION: 1. Positive for a small lacunar infarct of the dorsal right pons, Hyperacute by MRI. No hemorrhage or mass effect. 2. Advanced underlying signal changes in the brain most compatible with chronic small vessel disease. Late subacute appearance of a left corona radiata infarct. Occasional chronic micro hemorrhages in the brain. 3. MRA and Cervical MRI reported separately. Electronically Signed   By: Odessa Fleming M.D.   On: 01/30/2023 08:42   CT Head Wo Contrast  Result Date: 01/30/2023 CLINICAL DATA:  52 year old male with left side numbness upon waking. EXAM: CT HEAD WITHOUT CONTRAST TECHNIQUE: Contiguous axial images were obtained from the base of the skull through the vertex without intravenous contrast. RADIATION DOSE REDUCTION: This exam was performed according to the departmental dose-optimization program which includes automated exposure control, adjustment of the mA and/or kV according to patient size and/or use of iterative reconstruction technique. COMPARISON:  Neck CT 09/25/2013. FINDINGS: Brain: Normal cerebral volume. No midline shift, ventriculomegaly, mass effect, evidence of mass lesion, intracranial hemorrhage or evidence of cortically based acute infarction. Advanced for age, scattered bilateral cerebral white matter hypodensity, especially in the right corona radiata (series 2, image 17). Some involvement of the left lentiform on that side. Right hemisphere less affected. Brainstem and cerebellum gray-white differentiation within normal limits. Vascular: No suspicious intracranial vascular hyperdensity. Skull: Smooth and benign-appearing left parietal bony exostosis series 4, image 47. This level is not included on the prior neck CT. No other  osseous abnormality identified. Sinuses/Orbits: Visualized paranasal sinuses and mastoids are clear. Other: Visualized orbits and scalp soft tissues are within normal limits. IMPRESSION: 1. Advanced, age indeterminate cerebral white matter changes more pronounced in the left hemisphere. These are nonspecific but most commonly due to small vessel ischemia. 2. No other acute intracranial abnormality. 3. Benign-appearing left parietal bone exostosis (no follow-up imaging recommended). Electronically Signed   By: Odessa Fleming M.D.   On: 01/30/2023 04:13        Scheduled Meds:  aspirin EC  81 mg Oral Daily   atorvastatin  40 mg Oral Daily   clopidogrel  75 mg Oral Daily   enoxaparin (LOVENOX) injection  40 mg Subcutaneous Q24H     LOS: 0 days    Time spent: 35 minutes    Aliena Ghrist Hoover Brunette, DO [facility]  If 7PM-7AM, please contact night-coverage [email or web address] 01/31/2023, 4:35 PM

## 2023-01-31 NOTE — Evaluation (Addendum)
Occupational Therapy Evaluation Patient Details Name: Jermaine Shaw MRN: [id number] DOB: 1971/06/02 Today's Date: 01/31/2023   History of Present Illness Jermaine Shaw is a 52 y.o. male with medical history significant for tobacco abuse who presented to the ED with left arm numbness and heaviness.  He woke up at approximately 2 AM with the symptoms and also had a little bit of difficulty with his speech.  He went to bed at approximately 11 PM with no symptoms noted at that time.  He states that he also woke up with some mild headache and took a Goody powder prior to coming here.  He denies any specific aggravating or alleviating factors and states that his headache has resolved.  He denies taking any other home medications for any reason and has no other diagnoses.  He smokes 1 pack/day. (per DO)   Clinical Impression   Pt agreeable to OT and PT co-evaluation. Pt is independent at baseline and required min G to supervision today for standing ADL tasks and transfers. Pt presents with mild gross motor deficits and decreased sensation in L UE. Pt does well with seated ADL tasks and has good strength other than L wrist extension. Pt reports that his daughter can stay with him 24/7 for a week or two. Pt reports blurry vision but tested well in brief vision assessment.  Pt is not recommended for further acute OT services and will be discharged to care of nursing staff for remaining length of stay.      Recommendations for follow up therapy are one component of a multi-disciplinary discharge planning process, led by the attending physician.  Recommendations may be updated based on patient status, additional functional criteria and insurance authorization.   Assistance Recommended at Discharge Set up Supervision/Assistance  Patient can return home with the following A little help with walking and/or transfers;Assist for transportation;Assistance with cooking/housework    Functional Status Assessment   Patient has had a recent decline in their functional status and demonstrates the ability to make significant improvements in function in a reasonable and predictable amount of time.  Equipment Recommendations  None recommended by OT    Recommendations for Other Services Other (comment) (Follow up with vision specialist if blurry vision and trouble focusing continues.)     Precautions / Restrictions Precautions Precautions: None Restrictions Weight Bearing Restrictions: No      Mobility Bed Mobility Overal bed mobility: Independent                  Transfers Overall transfer level: Needs assistance Equipment used: Straight cane Transfers: Sit to/from Stand, Bed to chair/wheelchair/BSC Sit to Stand: Supervision, Min guard     Step pivot transfers: Supervision, Min guard     General transfer comment: Unsteady in standing; noted to lean on walls without cane.      Balance Overall balance assessment: Needs assistance Sitting-balance support: No upper extremity supported, Feet supported Sitting balance-Leahy Scale: Good Sitting balance - Comments: seated at EOB   Standing balance support: Single extremity supported, During functional activity Standing balance-Leahy Scale: Fair Standing balance comment: using cane                           ADL either performed or assessed with clinical judgement   ADL Overall ADL's : Needs assistance/impaired     Grooming: Min guard;Standing;Supervision/safety       Lower Body Bathing: Modified independent;Sitting/lateral leans   Upper Body Dressing : Modified  independent;Sitting   Lower Body Dressing: Modified independent;Sitting/lateral leans Lower Body Dressing Details (indicate cue type and reason): Able to doff and don sock at EOB. Toilet Transfer: Supervision/safety;Min guard;Ambulation (cane) Toilet Transfer Details (indicate cue type and reason): Simulated via ambulation in hall from EOB.          Functional mobility during ADLs: Supervision/safety;Min guard;Cane General ADL Comments: Pt able to ambulate over 100 feet in the hall.     Vision Baseline Vision/History: 0 No visual deficits Ability to See in Adequate Light: 1 Impaired Patient Visual Report: Blurring of vision;Other (comment) (Difficulty focusing.) Vision Assessment?: Yes Alignment/Gaze Preference: Within Defined Limits Tracking/Visual Pursuits: Able to track stimulus in all quads without difficulty Convergence: Within functional limits Visual Fields: No apparent deficits Additional Comments: Pt could read the board in the room but reports blurry vision.                Pertinent Vitals/Pain Pain Assessment Pain Assessment: No/denies pain     Hand Dominance Right   Extremity/Trunk Assessment Upper Extremity Assessment Upper Extremity Assessment: LUE deficits/detail LUE Deficits / Details: Mild deficit in gross motor coordination. 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 Wrist extension was reportedly sore and 4+/5 in strength. LUE Sensation: decreased proprioception;decreased light touch LUE Coordination: decreased gross motor   Lower Extremity Assessment Lower Extremity Assessment: Defer to PT evaluation   Cervical / Trunk Assessment Cervical / Trunk Assessment: Normal   Communication Communication Communication: No difficulties   Cognition Arousal/Alertness: Awake/alert Behavior During Therapy: WFL for tasks assessed/performed Overall Cognitive Status: Within Functional Limits for tasks assessed                                                        Home Living Family/patient expects to be discharged to:: Private residence Living Arrangements: Alone Available Help at Discharge: Family;Other (Comment) (Pt reports his daughter can assist him 24/7 for a week or two.) Type of Home: House Home Access: Stairs to enter [employer] of Steps: 6  total Entrance Stairs-Rails: Right;Left Home Layout: One level     Bathroom Shower/Tub: Chief Strategy Officer: Standard Bathroom Accessibility: Yes How Accessible: Accessible via walker Home Equipment: None          Prior Functioning/Environment Prior Level of Function : Independent/Modified Independent             Mobility Comments: Tourist information centre manager without AD ADLs Comments: Independent                         OT Frequency:      Co-evaluation PT/OT/SLP Co-Evaluation/Treatment: Yes Reason for Co-Treatment: To address functional/ADL transfers   OT goals addressed during session: ADL's and self-care      AM-PAC OT "6 Clicks" Daily Activity     Outcome Measure Help from another person eating meals?: None Help from another person taking care of personal grooming?: None Help from another person toileting, which includes using toliet, bedpan, or urinal?: None Help from another person bathing (including washing, rinsing, drying)?: A Little Help from another person to put on and taking off regular upper body clothing?: None Help from another person to put on and taking off regular lower body clothing?: None 6 Click Score: 23   End of Session Equipment  Utilized During Treatment: Other (comment) (cane)  Activity Tolerance: Patient tolerated treatment well Patient left: in bed;with call bell/phone within reach  OT Visit Diagnosis: Unsteadiness on feet (R26.81);Other abnormalities of gait and mobility (R26.89);Other symptoms and signs involving the nervous system (Z61.096)                Time: 0454-0981 OT Time Calculation (min): 19 min Charges:  OT General Charges $OT Visit: 1 Visit OT Evaluation $OT Eval Low Complexity: 1 Low  Ynez Eugenio OT, MOT  Danie Chandler 01/31/2023, 9:37 AM

## 2023-01-31 NOTE — TOC Progression Note (Addendum)
Transition of Care [facility]) - Progression Note    Patient Details  Name: Jermaine Shaw MRN: [id number] Date of Birth: Sep 09, 1971  Transition of Care [facility]) CM/SW Contact  Karn Cassis, [place] Phone Number: 01/31/2023, 8:54 AM  Clinical Narrative:  PT, OT, SLP recommending outpatient therapy. LCSW discussed with pt who is agreeable to [facility]. Referral made for all therapies. No other needs reported by pt at this time. TOC will follow.    Update: PT recommending cane. Discussed with pt who plans to get on his own.       Expected Discharge Plan and Services                                               Social Determinants of Health (SDOH) Interventions SDOH Screenings   Tobacco Use: High Risk (01/30/2023)    Readmission Risk Interventions     No data to display

## 2023-01-31 NOTE — Consult Note (Signed)
Cardiology Consultation   Patient ID: Jermaine Shaw MRN: [id number]; DOB: 07/04/1971  Admit date: 01/30/2023 Date of Consult: 01/31/2023  PCP:  [facility]   Patient Profile:   Jermaine Shaw is a 52 y.o. male with a hx of CVA who is being seen 01/31/2023 for the evaluation of HFrEF  at the request of Dr Sherryll Burger.  History of Present Illness:   Jermaine Shaw is a 52 yo with a hx of tob abuse.  PResented to [facility] on 01/30/23 with L arm numbness, heaviness.  CT ?MRI shoed acute CVA in R dosal pons   Neuro impression that most likely due to small vessel dz, less likely embolic  REcomm permissive HTN   The patient denies Hx of CP   No SOB until right now in hospital  No palpitations Rare ETOH   No viral infection       History reviewed. No pertinent past medical history.  Past Surgical History:  Procedure Laterality Date   BACK SURGERY         Inpatient Medications: Scheduled Meds:  aspirin EC  81 mg Oral Daily   atorvastatin  40 mg Oral Daily   clopidogrel  75 mg Oral Daily   enoxaparin (LOVENOX) injection  40 mg Subcutaneous Q24H   Continuous Infusions:  PRN Meds: acetaminophen **OR** acetaminophen (TYLENOL) oral liquid 160 mg/5 mL **OR** acetaminophen, hydrALAZINE  Allergies:   No Known Allergies  Social History:   Social History   Socioeconomic History   Marital status: Married    Spouse name: Not on file   Number of children: Not on file   Years of education: Not on file   Highest education level: Not on file  Occupational History   Not on file  Tobacco Use   Smoking status: Every Day    Packs/day: 1    Types: Cigarettes   Smokeless tobacco: Not on file  Vaping Use   Vaping Use: Never used  Substance and Sexual Activity   Alcohol use: Yes   Drug use: No   Sexual activity: Yes  Other Topics Concern   Not on file  Social History Narrative   Not on file   Social Determinants of Health   Financial Resource Strain: Not on file  Food  Insecurity: Not on file  Transportation Needs: Not on file  Physical Activity: Not on file  Stress: Not on file  Social Connections: Not on file  Intimate Partner Violence: Not on file    Family History:   History reviewed. No pertinent family history.   ROS:  Please see the history of present illness.   All other ROS reviewed and negative.     Physical Exam/Data:   Vitals:   01/30/23 1456 01/30/23 2007 01/30/23 2349 01/31/23 0407  BP: (!) 142/89 (!) 161/97 (!) 163/81 (!) 173/86  Pulse: 79 72 71 79  Resp: 18 16 18 20   Temp: 98.5 F (36.9 C) 98.6 F (37 C) 98.3 F (36.8 C) 98.9 F (37.2 C)  TempSrc: Oral Oral Oral Oral  SpO2: 97% 98% 100% 98%  Weight:      Height:        Intake/Output Summary (Last 24 hours) at 01/31/2023 1309 Last data filed at 01/30/2023 1700 Gross per 24 hour  Intake 245.55 ml  Output --  Net 245.55 ml      01/30/2023   10:39 AM 01/30/2023    2:51 AM 09/25/2013    8:11 PM  Last 3 Weights  Weight (lbs) 156 lb 15.5 oz 160 lb 140 lb  Weight (kg) 71.2 kg 72.576 kg 63.504 kg     Body mass index is 23.87 kg/m.  General:  Well nourished, well developed, in no acute distress HEENT: normal Neck: no JVD    Vascular: No carotid bruits; Distal pulses 2+ bilaterally Cardiac:  normal S1, S2; RRR; no murmur  Lungs:  clear to auscultation bilaterally Abd: soft, nontender, no hepatomegaly  Ext: no LE edema   EKG:  The EKG was personally reviewed and demonstrates:  NSR 78 bpm    Telemetry:  Telemetry was personally reviewed and demonstrates:  SR   Relevant CV Studies: Echo 01/30/23  1. Left ventricular ejection fraction, by estimation, is 30 to 35%. The  left ventricle has moderately decreased function. The left ventricle  demonstrates global hypokinesis. Left ventricular diastolic parameters are  consistent with Grade I diastolic  dysfunction (impaired relaxation).   2. Right ventricular systolic function is normal. The right ventricular  size is  normal. There is normal pulmonary artery systolic pressure.   3. The mitral valve is normal in structure. Mild mitral valve  regurgitation. No evidence of mitral stenosis.   4. The tricuspid valve is abnormal.   5. The aortic valve is tricuspid. Aortic valve regurgitation is not  visualized. No aortic stenosis is present.   6. The inferior vena cava is normal in size with greater than 50%  respiratory variability, suggesting right atrial pressure of 3 mmHg.    Laboratory Data:  High Sensitivity Troponin:  No results for input(s): "TROPONINIHS" in the last 720 hours.   Chemistry Recent Labs  Lab 01/30/23 0315 01/30/23 0948  NA 141  --   K 3.6  --   CL 108  --   CO2 23  --   GLUCOSE 140*  --   BUN 11  --   CREATININE 0.95 0.89  CALCIUM 9.0  --   GFRNONAA >60 >60  ANIONGAP 10  --     No results for input(s): "PROT", "ALBUMIN", "AST", "ALT", "ALKPHOS", "BILITOT" in the last 168 hours. Lipids  Recent Labs  Lab 01/31/23 0417  CHOL 161  TRIG 60  HDL 40*  LDLCALC 109*  CHOLHDL 4.0    Hematology Recent Labs  Lab 01/30/23 0315 01/30/23 0948  WBC 6.7 11.4*  RBC 4.48 4.37  HGB 14.2 13.9  HCT 42.6 41.0  MCV 95.1 93.8  MCH 31.7 31.8  MCHC 33.3 33.9  RDW 13.1 12.9  PLT 221 219   Thyroid No results for input(s): "TSH", "FREET4" in the last 168 hours.  BNPNo results for input(s): "BNP", "PROBNP" in the last 168 hours.  DDimer No results for input(s): "DDIMER" in the last 168 hours.   Radiology/Studies:  ECHOCARDIOGRAM COMPLETE  Result Date: 01/30/2023    ECHOCARDIOGRAM REPORT   Patient Name:   Jermaine Shaw Date of Exam: 01/30/2023 Medical Rec #:  [id number]       Height:       68.0 in Accession #:    [id number]      Weight:       157.0 lb Date of Birth:  1970-09-18       BSA:          1.844 m Patient Age:    52 years        BP:           142/89 mmHg Patient Gender: M  HR:           68 bpm. [facility] Procedure: 2D Echo, Color Doppler,  Cardiac Doppler and Intracardiac            Opacification Agent Indications:    Stroke I63.9  History:        Patient has no prior history of Echocardiogram examinations.                 CVA; Risk Factors:Current Smoker.  Sonographer:    Aron Baba Referring Phys: [id number] PRATIK D [facility] IMPRESSIONS  1. Left ventricular ejection fraction, by estimation, is 30 to 35%. The left ventricle has moderately decreased function. The left ventricle demonstrates global hypokinesis. Left ventricular diastolic parameters are consistent with Grade I diastolic dysfunction (impaired relaxation).  2. Right ventricular systolic function is normal. The right ventricular size is normal. There is normal pulmonary artery systolic pressure.  3. The mitral valve is normal in structure. Mild mitral valve regurgitation. No evidence of mitral stenosis.  4. The tricuspid valve is abnormal.  5. The aortic valve is tricuspid. Aortic valve regurgitation is not visualized. No aortic stenosis is present.  6. The inferior vena cava is normal in size with greater than 50% respiratory variability, suggesting right atrial pressure of 3 mmHg. FINDINGS  Left Ventricle: Left ventricular ejection fraction, by estimation, is 30 to 35%. The left ventricle has moderately decreased function. The left ventricle demonstrates global hypokinesis. Definity contrast agent was given IV to delineate the left ventricular endocardial borders. The left ventricular internal cavity size was normal in size. There is no left ventricular hypertrophy. Left ventricular diastolic parameters are consistent with Grade I diastolic dysfunction (impaired relaxation). Normal  left ventricular filling pressure. Right Ventricle: The right ventricular size is normal. Right vetricular wall thickness was not well visualized. Right ventricular systolic function is normal. There is normal pulmonary artery systolic pressure. The tricuspid regurgitant velocity is 2.35 m/s, and with an assumed  right atrial pressure of 3 mmHg, the estimated right ventricular systolic pressure is 25.1 mmHg. Left Atrium: Left atrial size was normal in size. Right Atrium: Right atrial size was normal in size. Pericardium: There is no evidence of pericardial effusion. Mitral Valve: The mitral valve is normal in structure. Mild mitral valve regurgitation. No evidence of mitral valve stenosis. Tricuspid Valve: The tricuspid valve is abnormal. Tricuspid valve regurgitation is mild . No evidence of tricuspid stenosis. Aortic Valve: The aortic valve is tricuspid. Aortic valve regurgitation is not visualized. No aortic stenosis is present. Aortic valve mean gradient measures 2.5 mmHg. Aortic valve peak gradient measures 1.4 mmHg. Aortic valve area, by VTI measures 2.28 cm. Pulmonic Valve: The pulmonic valve was not well visualized. Pulmonic valve regurgitation is not visualized. No evidence of pulmonic stenosis. Aorta: The aortic root is normal in size and structure. Venous: The inferior vena cava is normal in size with greater than 50% respiratory variability, suggesting right atrial pressure of 3 mmHg. IAS/Shunts: The interatrial septum was not well visualized.  LEFT VENTRICLE PLAX 2D LVIDd:         5.30 cm   Diastology LVIDs:         4.60 cm   LV e' medial:    4.37 cm/s LV PW:         1.00 cm   LV E/e' medial:  17.0 LV IVS:        0.90 cm   LV e' lateral:   7.49 cm/s LVOT diam:  2.10 cm   LV E/e' lateral: 9.9 LV SV:         46 LV SV Index:   25 LVOT Area:     3.46 cm  RIGHT VENTRICLE RV S prime:     8.76 cm/s TAPSE (M-mode): 1.6 cm LEFT ATRIUM           Index        RIGHT ATRIUM           Index LA diam:      2.50 cm 1.36 cm/m   RA Area:     16.20 cm LA Vol (A2C): 42.3 ml 22.94 ml/m  RA Volume:   44.00 ml  23.86 ml/m LA Vol (A4C): 31.8 ml 17.25 ml/m  AORTIC VALVE AV Area (Vmax):    3.99 cm AV Area (Vmean):   1.89 cm AV Area (VTI):     2.28 cm AV Vmax:           58.11 cm/s AV Vmean:          74.401 cm/s AV VTI:             0.202 m AV Peak Grad:      1.4 mmHg AV Mean Grad:      2.5 mmHg LVOT Vmax:         67.00 cm/s LVOT Vmean:        40.700 cm/s LVOT VTI:          0.133 m LVOT/AV VTI ratio: 0.66  AORTA Ao Root diam: 3.90 cm Ao Asc diam:  3.10 cm MITRAL VALVE                TRICUSPID VALVE MV Area (PHT): 3.63 cm     TR Peak grad:   22.1 mmHg MV Decel Time: 209 msec     TR Vmax:        235.00 cm/s MR Peak grad: 73.8 mmHg MR Vmax:      429.50 cm/s   SHUNTS MV E velocity: 74.10 cm/s   Systemic VTI:  0.13 m MV A velocity: 111.00 cm/s  Systemic Diam: 2.10 cm MV E/A ratio:  0.67 Dina Rich MD Electronically signed by Dina Rich MD Signature Date/Time: 01/30/2023/3:32:53 PM    Final    US Carotid Bilateral  Result Date: 01/30/2023 CLINICAL DATA:  Stroke, left arm numbness EXAM: BILATERAL CAROTID DUPLEX ULTRASOUND TECHNIQUE: Wallace Cullens scale imaging, color Doppler and duplex ultrasound were performed of bilateral carotid and vertebral arteries in the neck. COMPARISON:  CT 09/25/2013 FINDINGS: Criteria: Quantification of carotid stenosis is based on velocity parameters that correlate the residual internal carotid diameter with NASCET-based stenosis levels, using the diameter of the distal internal carotid lumen as the denominator for stenosis measurement. The following velocity measurements were obtained: RIGHT ICA: 126/40 cm/sec CCA: 82/13 cm/sec SYSTOLIC ICA/CCA RATIO:  1.5 ECA: 72 cm/sec LEFT ICA: 80/20 cm/sec CCA: 73/20 cm/sec SYSTOLIC ICA/CCA RATIO:  1.1 ECA: 63 cm/sec RIGHT CAROTID ARTERY: Mild smooth plaque in the distal common carotid artery without significant stenosis. Normal waveforms and color Doppler signal throughout. RIGHT VERTEBRAL ARTERY:  Normal flow direction and waveform. LEFT CAROTID ARTERY: Partially calcified plaque in the proximal left ECA resulting in only mild short-segment stenosis. Normal waveforms and color Doppler signal throughout. LEFT VERTEBRAL ARTERY:  Normal flow direction and waveform. IMPRESSION:  1. Bilateral carotid bifurcation plaque resulting in less than 50% diameter ICA stenosis. 2. Antegrade bilateral vertebral arterial flow. Electronically Signed   By: Ronald Pippins.D.  On: 01/30/2023 14:21   MR Cervical Spine Wo Contrast  Result Date: 01/30/2023 CLINICAL DATA:  52 year old male with left side numbness upon waking. Neurologic deficit versus radiculopathy. EXAM: MRI CERVICAL SPINE WITHOUT CONTRAST TECHNIQUE: Multiplanar, multisequence MR imaging of the cervical spine was performed. No intravenous contrast was administered. COMPARISON:  Brain MRI today. FINDINGS: Alignment: Straightening of cervical lordosis. No significant spondylolisthesis. Vertebrae: Normal background bone marrow signal. Faint degenerative C5-C6 endplate marrow edema superimposed on endplate spurring, eccentric to the right. See additional details below. Cord: No cervical spinal cord signal abnormality identified despite degenerative spinal cord mass effect, detailed below. Posterior Fossa, vertebral arteries, paraspinal tissues: Cervicomedullary junction is within normal limits. C posterior fossa findings on MRI today reported separately. Preserved major vascular flow voids in the neck. The right vertebral artery appears dominant. Negative visible neck soft tissues and lung apices. Disc levels: C2-C3: Mild to moderate facet hypertrophy on the left. Mild left C3 foraminal stenosis. C3-C4: Disc space loss. Circumferential disc bulge and endplate spurring with broad-based posterior component. Mild spinal stenosis and spinal cord mass effect (series 12, image 8). Moderate to severe left and mild right C4 foraminal stenosis. C4-C5: Mild disc bulging and endplate spurring. No significant stenosis. C5-C6: Disc space loss with circumferential disc bulge and endplate spurring eccentric to the right. Broad-based posterior component with mild spinal stenosis and spinal cord mass effect (series 12 image 18). Moderate left, moderate to  severe right C6 foraminal stenosis. C6-C7: Disc bulging. Mild endplate spurring. Mild to moderate right C7 neural foraminal stenosis. No significant spinal stenosis. C7-T1: Mild facet hypertrophy and endplate spurring greater on the right. No significant stenosis. Negative visible upper thoracic spinal canal. IMPRESSION: 1. 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 No spinal cord signal abnormality identified. 2. Associated moderate to severe neural foraminal stenosis at the left C4 and bilateral C6 nerve levels. Up to moderate right C7 foraminal stenosis. Electronically Signed   By: Odessa Fleming M.D.   On: 01/30/2023 08:55   MR ANGIO HEAD WO CONTRAST  Result Date: 01/30/2023 CLINICAL DATA:  52 year old male with left side numbness upon waking. Small vessel disease in the brain with hyperacute MRI appearance of dorsal brainstem lacune. EXAM: MRA HEAD WITHOUT CONTRAST TECHNIQUE: Angiographic images of the Circle of Willis were acquired using MRA technique without intravenous contrast. COMPARISON:  MRI 0728 hours today reported separately. FINDINGS: Anterior circulation: Antegrade flow in both ICA siphons. Mild bilateral siphon irregularity. No siphon stenosis. However, there is a small 3-4 mm inferiorly directed distal right ICA siphon infundibulum versus aneurysm. See series 5, image 76 and series 1038, image 4. Patent carotid termini. Normal MCA and ACA origins. Anterior communicating artery might be fenestrated, normal variant. Proximal A2 segments also mildly tortuous. No convincing anterior communicating artery aneurysm. Visible ACA branches otherwise within normal limits. Bilateral MCA M1 segments and MCA bifurcations appear patent without stenosis. It appears the left anterior temporal artery arises directly from the ICA terminus (normal variant). Other visible bilateral MCA branches are within normal limits. Posterior circulation:  Antegrade flow in the posterior circulation. Dominant appearing distal right vertebral artery. Both PICA origins are patent. No distal vertebral or basilar artery stenosis is identified. Mildly irregular basilar artery. SCA and PCA origins remain normal. Posterior communicating arteries are diminutive or absent. Bilateral PCA branches are within normal limits. Anatomic variants: Dominant right vertebral artery. Left anterior temporal artery arises directly from the ICA. Possible fenestrated  anterior communicating artery. Other: Brain MRI today reported separately. IMPRESSION: 1. Negative for large vessel occlusion. Evidence of mild intracranial atherosclerosis. 2. Positive for distal right ICA saccular 3-4 mm lesion which could be an infundibulum or small aneurysm. Recommend Neuro-Endovascular follow-up to evaluate the appropriateness of potential treatment. MRI Brain findings and preliminary report of this MRA (no large vessel occlusion) were discussed by telephone with Dr. Glendora Score on 01/30/2023 at 0843 hours today. Electronically Signed   By: Odessa Fleming M.D.   On: 01/30/2023 08:52   MR BRAIN WO CONTRAST  Result Date: 01/30/2023 CLINICAL DATA:  52 year old male with left side numbness upon waking. EXAM: MRI HEAD WITHOUT CONTRAST TECHNIQUE: Multiplanar, multiecho pulse sequences of the brain and surrounding structures were obtained without intravenous contrast. COMPARISON:  Head CT 0328 hours today. FINDINGS: Brain: Small linear dorsal right brainstem focus of restricted diffusion (series 5, image 9). Minimal associated FLAIR hyperintensity series 12, image 7. No evidence of associated hemorrhage or mass effect. Abnormal diffusion along the margins of an otherwise chronic appearing lacunar infarct of the left corona radiata, well visible by CT today and with central cystic encephalomalacia. No hemosiderin there. No other abnormal diffusion. But additional patchy and somewhat nodular bilateral periventricular  white matter and central white matter T2 and FLAIR hyperintensity in both hemispheres. Chronic microhemorrhage in the posterior left hemisphere series 11, image 12. And evidence of a tiny chronic microhemorrhage in the right cerebellum on image 6. Possible tiny chronic lacunar infarct of the left thalamus. Right deep gray nuclei remain normal. Possible tiny chronic lacunar infarct of the left cerebellum series 13, image 16. No midline shift, mass effect, evidence of mass lesion, ventriculomegaly, extra-axial collection or acute intracranial hemorrhage. Cervicomedullary junction and pituitary are within normal limits. Background brain volume is normal for age. Vascular: Major intracranial vascular flow voids are preserved. The distal right vertebral artery appears dominant and there is a degree of generalized intracranial artery tortuosity. Skull and upper cervical spine: Dedicated cervical spine is reported separately today. Visualized bone marrow signal is within normal limits. Sinuses/Orbits: Orbits appear symmetric and negative. Paranasal Visualized paranasal sinuses and mastoids are stable and well aerated. Other: Visible internal auditory structures appear normal. Negative visible scalp and face. IMPRESSION: 1. Positive for a small lacunar infarct of the dorsal right pons, Hyperacute by MRI. No hemorrhage or mass effect. 2. Advanced underlying signal changes in the brain most compatible with chronic small vessel disease. Late subacute appearance of a left corona radiata infarct. Occasional chronic micro hemorrhages in the brain. 3. MRA and Cervical MRI reported separately. Electronically Signed   By: Odessa Fleming M.D.   On: 01/30/2023 08:42   CT Head Wo Contrast  Result Date: 01/30/2023 CLINICAL DATA:  52 year old male with left side numbness upon waking. EXAM: CT HEAD WITHOUT CONTRAST TECHNIQUE: Contiguous axial images were obtained from the base of the skull through the vertex without intravenous contrast.  RADIATION DOSE REDUCTION: This exam was performed according to the departmental dose-optimization program which includes automated exposure control, adjustment of the mA and/or kV according to patient size and/or use of iterative reconstruction technique. COMPARISON:  Neck CT 09/25/2013. FINDINGS: Brain: Normal cerebral volume. No midline shift, ventriculomegaly, mass effect, evidence of mass lesion, intracranial hemorrhage or evidence of cortically based acute infarction. Advanced for age, scattered bilateral cerebral white matter hypodensity, especially in the right corona radiata (series 2, image 17). Some involvement of the left lentiform on that side. Right hemisphere less affected. Brainstem and cerebellum  gray-white differentiation within normal limits. Vascular: No suspicious intracranial vascular hyperdensity. Skull: Smooth and benign-appearing left parietal bony exostosis series 4, image 47. This level is not included on the prior neck CT. No other osseous abnormality identified. Sinuses/Orbits: Visualized paranasal sinuses and mastoids are clear. Other: Visualized orbits and scalp soft tissues are within normal limits. IMPRESSION: 1. Advanced, age indeterminate cerebral white matter changes more pronounced in the left hemisphere. These are nonspecific but most commonly due to small vessel ischemia. 2. No other acute intracranial abnormality. 3. Benign-appearing left parietal bone exostosis (no follow-up imaging recommended). Electronically Signed   By: Odessa Fleming M.D.   On: 01/30/2023 04:13     Assessment and Plan:   HFrEF  I have reviewed echo images    I think LV function is some better than 30 to 35%     Etiology is unclear   EKG without Q waves He denies CP    ? Due to HTN untreated   ? idiopathic       Current volume status is OK With recent CVA neuro is recommending  permissive HTN    When it is OK to lower BP from neuro standpoint , I would begin goal directed medically for HFrEF   Plan then  for outpt follow up with echo, BP, etc  2  CVA   Neuro has seen patient   Feel most likely due to small vessel dz, less likely embolic    Could set up for outpt monitor after admit  3  HTN  Follow when begin Rx for HFrEF      For questions or updates, please contact [facility] Please consult [email or web address] for contact info under    Signed, Dietrich Pates, MD  01/31/2023 1:09 PM

## 2023-01-31 NOTE — Plan of Care (Signed)
  Problem: Acute Rehab PT Goals(only PT should resolve) Goal: Pt Will Go Supine/Side To Sit Outcome: Progressing Flowsheets (Taken 01/31/2023 1358) Pt will go Supine/Side to Sit: Independently Goal: Patient Will Transfer Sit To/From Stand Outcome: Progressing Flowsheets (Taken 01/31/2023 1358) Patient will transfer sit to/from stand: with modified independence Goal: Pt Will Transfer Bed To Chair/Chair To Bed Outcome: Progressing Flowsheets (Taken 01/31/2023 1358) Pt will Transfer Bed to Chair/Chair to Bed: with modified independence Goal: Pt Will Ambulate Outcome: Progressing Flowsheets (Taken 01/31/2023 1358) Pt will Ambulate:  > 125 feet  with modified independence  with cane   1:59 PM, 01/31/23 Ocie Bob, MPT Physical Therapist with [facility] [phone] office [phone] mobile phone

## 2023-02-01 ENCOUNTER — Telehealth (HOSPITAL_COMMUNITY): Payer: Self-pay | Admitting: Pharmacy Technician

## 2023-02-01 ENCOUNTER — Other Ambulatory Visit (HOSPITAL_COMMUNITY): Payer: Self-pay

## 2023-02-01 DIAGNOSIS — I6381 Other cerebral infarction due to occlusion or stenosis of small artery: Secondary | ICD-10-CM | POA: Diagnosis present

## 2023-02-01 DIAGNOSIS — I110 Hypertensive heart disease with heart failure: Secondary | ICD-10-CM | POA: Diagnosis present

## 2023-02-01 DIAGNOSIS — I639 Cerebral infarction, unspecified: Secondary | ICD-10-CM | POA: Diagnosis present

## 2023-02-01 DIAGNOSIS — I10 Essential (primary) hypertension: Secondary | ICD-10-CM

## 2023-02-01 DIAGNOSIS — Z720 Tobacco use: Secondary | ICD-10-CM

## 2023-02-01 DIAGNOSIS — R200 Anesthesia of skin: Secondary | ICD-10-CM | POA: Diagnosis present

## 2023-02-01 DIAGNOSIS — I5020 Unspecified systolic (congestive) heart failure: Secondary | ICD-10-CM | POA: Diagnosis not present

## 2023-02-01 DIAGNOSIS — I5021 Acute systolic (congestive) heart failure: Secondary | ICD-10-CM | POA: Diagnosis present

## 2023-02-01 DIAGNOSIS — Z79899 Other long term (current) drug therapy: Secondary | ICD-10-CM | POA: Diagnosis not present

## 2023-02-01 DIAGNOSIS — R471 Dysarthria and anarthria: Secondary | ICD-10-CM | POA: Diagnosis present

## 2023-02-01 DIAGNOSIS — F17210 Nicotine dependence, cigarettes, uncomplicated: Secondary | ICD-10-CM | POA: Diagnosis present

## 2023-02-01 DIAGNOSIS — R29702 NIHSS score 2: Secondary | ICD-10-CM | POA: Diagnosis present

## 2023-02-01 DIAGNOSIS — M5412 Radiculopathy, cervical region: Secondary | ICD-10-CM | POA: Diagnosis present

## 2023-02-01 MED ORDER — ATORVASTATIN CALCIUM 40 MG PO TABS: 40.0000 mg | ORAL_TABLET | Freq: Every day | ORAL | 3 refills | Status: DC

## 2023-02-01 MED ORDER — CLOPIDOGREL BISULFATE 75 MG PO TABS: 75.0000 mg | ORAL_TABLET | Freq: Every day | ORAL | 0 refills | Status: AC

## 2023-02-01 MED ORDER — SACUBITRIL-VALSARTAN 24-26 MG PO TABS: 1.0000 | ORAL_TABLET | Freq: Two times a day (BID) | ORAL | Status: DC

## 2023-02-01 MED ORDER — ASPIRIN 81 MG PO TBEC: 81.0000 mg | DELAYED_RELEASE_TABLET | Freq: Every day | ORAL | 12 refills | Status: AC

## 2023-02-01 MED ORDER — METOPROLOL SUCCINATE ER 25 MG PO TB24: 25.0000 mg | ORAL_TABLET | Freq: Two times a day (BID) | ORAL | Status: DC

## 2023-02-01 MED ORDER — METOPROLOL SUCCINATE ER 25 MG PO TB24: 25.0000 mg | ORAL_TABLET | Freq: Two times a day (BID) | ORAL | 2 refills | Status: DC

## 2023-02-01 MED ORDER — SACUBITRIL-VALSARTAN 24-26 MG PO TABS: 1.0000 | ORAL_TABLET | Freq: Two times a day (BID) | ORAL | 2 refills | Status: DC

## 2023-02-01 MED ORDER — FAMOTIDINE 40 MG PO TABS: 40.0000 mg | ORAL_TABLET | Freq: Every evening | ORAL | 1 refills | Status: AC

## 2023-02-01 NOTE — Progress Notes (Signed)
Patient slept through the night, no complaints of pain. Patient did state this am "my left leg feels heavy but no tingling or numbness." Continued to monitor.

## 2023-02-01 NOTE — Discharge Summary (Signed)
Physician Discharge Summary   Patient: Jermaine Shaw MRN: [id number] DOB: [birth year]  Admit date:     01/30/2023  Discharge date: 02/01/23  Discharge Physician: Kendell Bane   PCP: [facility]   Recommendations at discharge:  Follow with the PCP and cardiologist in 1-2 weeks Follow-up with neurologist in 2-3 weeks Continue aspirin Plavix (Plavix for 21 days)  Medication per cardiology subjective change is follow-up as an outpatient  Discharge Diagnoses: Principal Problem:   CVA (cerebral vascular accident) (HCC) Active Problems:   HFrEF (heart failure with reduced ejection fraction) (HCC)   Stroke [facility])  Resolved Problems:   * No resolved hospital problems. *  Jermaine Shaw is a 52 y.o. male with medical history significant for tobacco abuse who presented to the ED with left arm numbness and heaviness.  Patient was admitted with acute ischemic CVA and neurology has placed recommendations for discharge.  Unfortunately, 2D echocardiogram is now showing newly diagnosed severe LV dysfunction and cardiology is following with recommendations pending.      Principal Problem:   CVA (cerebral vascular accident) (HCC)   Acute reduced EF heart failure      Acute ischemic CVA -Cardiology recommending aspirin 81 mg daily and Plavix 75 mg daily for 3 weeks followed by aspirin 81 mg daily -Continue statins -Atorvastatin started for LDL 109 -Hemoglobin A1c 6.2% -Recommending 30-day event monitor to look for A-fib on discharge -Counseled on smoking cessation -Follow-up with neurology Dr. Pearlean Brownie arranged per Neurology    Severe LV dysfunction -2D echocardiogram with LVEF 30-35% -Appreciate cardiology recommendations for further evaluation and management -Cardiology added Entresto 24/26 and Toprol XL 25 mg daily    Meds can be titrated   History of tobacco abuse -Counseled on cessation     Consultants: Neurology/cardiology  Disposition: Home Diet  recommendation:  Discharge Diet Orders (From admission, onward)     Start     Ordered   02/01/23 0000  Diet - low sodium heart healthy        02/01/23 1127           Carb modified diet DISCHARGE MEDICATION: Allergies as of 02/01/2023   No Known Allergies      Medication List     STOP taking these medications    GOODY HEADACHE PO       TAKE these medications    aspirin EC 81 MG tablet Take 1 tablet (81 mg total) by mouth daily. Swallow whole. Start taking on: Feb 02, 2023   atorvastatin 40 MG tablet Commonly known as: LIPITOR Take 1 tablet (40 mg total) by mouth daily. Start taking on: Feb 02, 2023   clopidogrel 75 MG tablet Commonly known as: PLAVIX Take 1 tablet (75 mg total) by mouth daily for 21 days. Start taking on: Feb 02, 2023   famotidine 40 MG tablet Commonly known as: PEPCID Take 1 tablet (40 mg total) by mouth every evening.   metoprolol succinate 25 MG 24 hr tablet Commonly known as: TOPROL-XL Take 1 tablet (25 mg total) by mouth 2 (two) times daily with a meal.   sacubitril-valsartan 24-26 MG Commonly known as: ENTRESTO Take 1 tablet by mouth 2 (two) times daily.               Durable Medical Equipment  (From admission, onward)           Start     Ordered   01/31/23 1421  For home use only DME Cane  Once  01/31/23 1421            Discharge Exam: Filed Weights   01/30/23 0251 01/30/23 1039  Weight: 72.6 kg 71.2 kg        General:  AAO x 3,  cooperative, no distress;   HEENT:  Normocephalic, PERRL, otherwise with in Normal limits   Neuro:  Left-sided weakness, speech intact, CNII-XII intact. , normal motor and sensation, reflexes intact   Lungs:   Clear to auscultation BL, Respirations unlabored,  No wheezes / crackles  Cardio:    S1/S2, RRR, No murmure, No Rubs or Gallops   Abdomen:  Soft, non-tender, bowel sounds active all four quadrants, no guarding or peritoneal signs.  Muscular  skeletal:   Limited exam -left-sided, left arm weakness global generalized weaknesses - in bed, able to move all 4 extremities,   2+ pulses,  symmetric, No pitting edema  Skin:  Dry, warm to touch, negative for any Rashes,  Wounds: Please see nursing documentation          Condition at discharge: fair  The results of significant diagnostics from this hospitalization (including imaging, microbiology, ancillary and laboratory) are listed below for reference.   Imaging Studies: ECHOCARDIOGRAM COMPLETE  Result Date: 01/30/2023    ECHOCARDIOGRAM REPORT   Patient Name:   Jermaine Shaw Date of Exam: 01/30/2023 Medical Rec #:  [id number]       Height:       68.0 in Accession #:    [id number]      Weight:       157.0 lb Date of Birth:  03/21/1971       BSA:          1.844 m Patient Age:    52 years        BP:           142/89 mmHg Patient Gender: M               HR:           68 bpm. [facility] Procedure: 2D Echo, Color Doppler, Cardiac Doppler and Intracardiac            Opacification Agent Indications:    Stroke I63.9  History:        Patient has no prior history of Echocardiogram examinations.                 CVA; Risk Factors:Current Smoker.  Sonographer:    Aron Baba Referring Phys: [id number] PRATIK D [facility] IMPRESSIONS  1. Left ventricular ejection fraction, by estimation, is 30 to 35%. The left ventricle has moderately decreased function. The left ventricle demonstrates global hypokinesis. Left ventricular diastolic parameters are consistent with Grade I diastolic dysfunction (impaired relaxation).  2. Right ventricular systolic function is normal. The right ventricular size is normal. There is normal pulmonary artery systolic pressure.  3. The mitral valve is normal in structure. Mild mitral valve regurgitation. No evidence of mitral stenosis.  4. The tricuspid valve is abnormal.  5. The aortic valve is tricuspid. Aortic valve regurgitation is not visualized. No aortic stenosis is present.  6.  The inferior vena cava is normal in size with greater than 50% respiratory variability, suggesting right atrial pressure of 3 mmHg. FINDINGS  Left Ventricle: Left ventricular ejection fraction, by estimation, is 30 to 35%. The left ventricle has moderately decreased function. The left ventricle demonstrates global hypokinesis. Definity contrast agent was given IV to delineate the left ventricular endocardial borders.  The left ventricular internal cavity size was normal in size. There is no left ventricular hypertrophy. Left ventricular diastolic parameters are consistent with Grade I diastolic dysfunction (impaired relaxation). Normal  left ventricular filling pressure. Right Ventricle: The right ventricular size is normal. Right vetricular wall thickness was not well visualized. Right ventricular systolic function is normal. There is normal pulmonary artery systolic pressure. The tricuspid regurgitant velocity is 2.35 m/s, and with an assumed right atrial pressure of 3 mmHg, the estimated right ventricular systolic pressure is 25.1 mmHg. Left Atrium: Left atrial size was normal in size. Right Atrium: Right atrial size was normal in size. Pericardium: There is no evidence of pericardial effusion. Mitral Valve: The mitral valve is normal in structure. Mild mitral valve regurgitation. No evidence of mitral valve stenosis. Tricuspid Valve: The tricuspid valve is abnormal. Tricuspid valve regurgitation is mild . No evidence of tricuspid stenosis. Aortic Valve: The aortic valve is tricuspid. Aortic valve regurgitation is not visualized. No aortic stenosis is present. Aortic valve mean gradient measures 2.5 mmHg. Aortic valve peak gradient measures 1.4 mmHg. Aortic valve area, by VTI measures 2.28 cm. Pulmonic Valve: The pulmonic valve was not well visualized. Pulmonic valve regurgitation is not visualized. No evidence of pulmonic stenosis. Aorta: The aortic root is normal in size and structure. Venous: The inferior vena  cava is normal in size with greater than 50% respiratory variability, suggesting right atrial pressure of 3 mmHg. IAS/Shunts: The interatrial septum was not well visualized.  LEFT VENTRICLE PLAX 2D LVIDd:         5.30 cm   Diastology LVIDs:         4.60 cm   LV e' medial:    4.37 cm/s LV PW:         1.00 cm   LV E/e' medial:  17.0 LV IVS:        0.90 cm   LV e' lateral:   7.49 cm/s LVOT diam:     2.10 cm   LV E/e' lateral: 9.9 LV SV:         46 LV SV Index:   25 LVOT Area:     3.46 cm  RIGHT VENTRICLE RV S prime:     8.76 cm/s TAPSE (M-mode): 1.6 cm LEFT ATRIUM           Index        RIGHT ATRIUM           Index LA diam:      2.50 cm 1.36 cm/m   RA Area:     16.20 cm LA Vol (A2C): 42.3 ml 22.94 ml/m  RA Volume:   44.00 ml  23.86 ml/m LA Vol (A4C): 31.8 ml 17.25 ml/m  AORTIC VALVE AV Area (Vmax):    3.99 cm AV Area (Vmean):   1.89 cm AV Area (VTI):     2.28 cm AV Vmax:           58.11 cm/s AV Vmean:          74.401 cm/s AV VTI:            0.202 m AV Peak Grad:      1.4 mmHg AV Mean Grad:      2.5 mmHg LVOT Vmax:         67.00 cm/s LVOT Vmean:        40.700 cm/s LVOT VTI:          0.133 m LVOT/AV VTI ratio: 0.66  AORTA Ao Root diam:  3.90 cm Ao Asc diam:  3.10 cm MITRAL VALVE                TRICUSPID VALVE MV Area (PHT): 3.63 cm     TR Peak grad:   22.1 mmHg MV Decel Time: 209 msec     TR Vmax:        235.00 cm/s MR Peak grad: 73.8 mmHg MR Vmax:      429.50 cm/s   SHUNTS MV E velocity: 74.10 cm/s   Systemic VTI:  0.13 m MV A velocity: 111.00 cm/s  Systemic Diam: 2.10 cm MV E/A ratio:  0.67 Dina Rich MD Electronically signed by Dina Rich MD Signature Date/Time: 01/30/2023/3:32:53 PM    Final    US Carotid Bilateral  Result Date: 01/30/2023 CLINICAL DATA:  Stroke, left arm numbness EXAM: BILATERAL CAROTID DUPLEX ULTRASOUND TECHNIQUE: Wallace Cullens scale imaging, color Doppler and duplex ultrasound were performed of bilateral carotid and vertebral arteries in the neck. COMPARISON:  CT 09/25/2013 FINDINGS:  Criteria: Quantification of carotid stenosis is based on velocity parameters that correlate the residual internal carotid diameter with NASCET-based stenosis levels, using the diameter of the distal internal carotid lumen as the denominator for stenosis measurement. The following velocity measurements were obtained: RIGHT ICA: 126/40 cm/sec CCA: 82/13 cm/sec SYSTOLIC ICA/CCA RATIO:  1.5 ECA: 72 cm/sec LEFT ICA: 80/20 cm/sec CCA: 73/20 cm/sec SYSTOLIC ICA/CCA RATIO:  1.1 ECA: 63 cm/sec RIGHT CAROTID ARTERY: Mild smooth plaque in the distal common carotid artery without significant stenosis. Normal waveforms and color Doppler signal throughout. RIGHT VERTEBRAL ARTERY:  Normal flow direction and waveform. LEFT CAROTID ARTERY: Partially calcified plaque in the proximal left ECA resulting in only mild short-segment stenosis. Normal waveforms and color Doppler signal throughout. LEFT VERTEBRAL ARTERY:  Normal flow direction and waveform. IMPRESSION: 1. Bilateral carotid bifurcation plaque resulting in less than 50% diameter ICA stenosis. 2. Antegrade bilateral vertebral arterial flow. Electronically Signed   By: Corlis Leak M.D.   On: 01/30/2023 14:21   MR Cervical Spine Wo Contrast  Result Date: 01/30/2023 CLINICAL DATA:  52 year old male with left side numbness upon waking. Neurologic deficit versus radiculopathy. EXAM: MRI CERVICAL SPINE WITHOUT CONTRAST TECHNIQUE: Multiplanar, multisequence MR imaging of the cervical spine was performed. No intravenous contrast was administered. COMPARISON:  Brain MRI today. FINDINGS: Alignment: Straightening of cervical lordosis. No significant spondylolisthesis. Vertebrae: Normal background bone marrow signal. Faint degenerative C5-C6 endplate marrow edema superimposed on endplate spurring, eccentric to the right. See additional details below. Cord: No cervical spinal cord signal abnormality identified despite degenerative spinal cord mass effect, detailed below. Posterior Fossa,  vertebral arteries, paraspinal tissues: Cervicomedullary junction is within normal limits. C posterior fossa findings on MRI today reported separately. Preserved major vascular flow voids in the neck. The right vertebral artery appears dominant. Negative visible neck soft tissues and lung apices. Disc levels: C2-C3: Mild to moderate facet hypertrophy on the left. Mild left C3 foraminal stenosis. C3-C4: Disc space loss. Circumferential disc bulge and endplate spurring with broad-based posterior component. Mild spinal stenosis and spinal cord mass effect (series 12, image 8). Moderate to severe left and mild right C4 foraminal stenosis. C4-C5: Mild disc bulging and endplate spurring. No significant stenosis. C5-C6: Disc space loss with circumferential disc bulge and endplate spurring eccentric to the right. Broad-based posterior component with mild spinal stenosis and spinal cord mass effect (series 12 image 18). Moderate left, moderate to severe right C6 foraminal stenosis. C6-C7: Disc bulging. Mild endplate spurring. Mild to moderate right  C7 neural foraminal stenosis. No significant spinal stenosis. C7-T1: Mild facet hypertrophy and endplate spurring greater on the right. No significant stenosis. Negative visible upper thoracic spinal canal. IMPRESSION: 1. 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 No spinal cord signal abnormality identified. 2. Associated moderate to severe neural foraminal stenosis at the left C4 and bilateral C6 nerve levels. Up to moderate right C7 foraminal stenosis. Electronically Signed   By: Odessa Fleming M.D.   On: 01/30/2023 08:55   MR ANGIO HEAD WO CONTRAST  Result Date: 01/30/2023 CLINICAL DATA:  52 year old male with left side numbness upon waking. Small vessel disease in the brain with hyperacute MRI appearance of dorsal brainstem lacune. EXAM: MRA HEAD WITHOUT CONTRAST TECHNIQUE: Angiographic images of the Circle  of Willis were acquired using MRA technique without intravenous contrast. COMPARISON:  MRI 0728 hours today reported separately. FINDINGS: Anterior circulation: Antegrade flow in both ICA siphons. Mild bilateral siphon irregularity. No siphon stenosis. However, there is a small 3-4 mm inferiorly directed distal right ICA siphon infundibulum versus aneurysm. See series 5, image 76 and series 1038, image 4. Patent carotid termini. Normal MCA and ACA origins. Anterior communicating artery might be fenestrated, normal variant. Proximal A2 segments also mildly tortuous. No convincing anterior communicating artery aneurysm. Visible ACA branches otherwise within normal limits. Bilateral MCA M1 segments and MCA bifurcations appear patent without stenosis. It appears the left anterior temporal artery arises directly from the ICA terminus (normal variant). Other visible bilateral MCA branches are within normal limits. Posterior circulation: Antegrade flow in the posterior circulation. Dominant appearing distal right vertebral artery. Both PICA origins are patent. No distal vertebral or basilar artery stenosis is identified. Mildly irregular basilar artery. SCA and PCA origins remain normal. Posterior communicating arteries are diminutive or absent. Bilateral PCA branches are within normal limits. Anatomic variants: Dominant right vertebral artery. Left anterior temporal artery arises directly from the ICA. Possible fenestrated anterior communicating artery. Other: Brain MRI today reported separately. IMPRESSION: 1. Negative for large vessel occlusion. Evidence of mild intracranial atherosclerosis. 2. Positive for distal right ICA saccular 3-4 mm lesion which could be an infundibulum or small aneurysm. Recommend Neuro-Endovascular follow-up to evaluate the appropriateness of potential treatment. MRI Brain findings and preliminary report of this MRA (no large vessel occlusion) were discussed by telephone with Dr. Glendora Score  on 01/30/2023 at 0843 hours today. Electronically Signed   By: Odessa Fleming M.D.   On: 01/30/2023 08:52   MR BRAIN WO CONTRAST  Result Date: 01/30/2023 CLINICAL DATA:  52 year old male with left side numbness upon waking. EXAM: MRI HEAD WITHOUT CONTRAST TECHNIQUE: Multiplanar, multiecho pulse sequences of the brain and surrounding structures were obtained without intravenous contrast. COMPARISON:  Head CT 0328 hours today. FINDINGS: Brain: Small linear dorsal right brainstem focus of restricted diffusion (series 5, image 9). Minimal associated FLAIR hyperintensity series 12, image 7. No evidence of associated hemorrhage or mass effect. Abnormal diffusion along the margins of an otherwise chronic appearing lacunar infarct of the left corona radiata, well visible by CT today and with central cystic encephalomalacia. No hemosiderin there. No other abnormal diffusion. But additional patchy and somewhat nodular bilateral periventricular white matter and central white matter T2 and FLAIR hyperintensity in both hemispheres. Chronic microhemorrhage in the posterior left hemisphere series 11, image 12. And evidence of a tiny chronic microhemorrhage in the right cerebellum on image 6. Possible tiny chronic lacunar infarct of the left thalamus. Right  deep gray nuclei remain normal. Possible tiny chronic lacunar infarct of the left cerebellum series 13, image 16. No midline shift, mass effect, evidence of mass lesion, ventriculomegaly, extra-axial collection or acute intracranial hemorrhage. Cervicomedullary junction and pituitary are within normal limits. Background brain volume is normal for age. Vascular: Major intracranial vascular flow voids are preserved. The distal right vertebral artery appears dominant and there is a degree of generalized intracranial artery tortuosity. Skull and upper cervical spine: Dedicated cervical spine is reported separately today. Visualized bone marrow signal is within normal limits.  Sinuses/Orbits: Orbits appear symmetric and negative. Paranasal Visualized paranasal sinuses and mastoids are stable and well aerated. Other: Visible internal auditory structures appear normal. Negative visible scalp and face. IMPRESSION: 1. Positive for a small lacunar infarct of the dorsal right pons, Hyperacute by MRI. No hemorrhage or mass effect. 2. Advanced underlying signal changes in the brain most compatible with chronic small vessel disease. Late subacute appearance of a left corona radiata infarct. Occasional chronic micro hemorrhages in the brain. 3. MRA and Cervical MRI reported separately. Electronically Signed   By: Odessa Fleming M.D.   On: 01/30/2023 08:42   CT Head Wo Contrast  Result Date: 01/30/2023 CLINICAL DATA:  52 year old male with left side numbness upon waking. EXAM: CT HEAD WITHOUT CONTRAST TECHNIQUE: Contiguous axial images were obtained from the base of the skull through the vertex without intravenous contrast. RADIATION DOSE REDUCTION: This exam was performed according to the departmental dose-optimization program which includes automated exposure control, adjustment of the mA and/or kV according to patient size and/or use of iterative reconstruction technique. COMPARISON:  Neck CT 09/25/2013. FINDINGS: Brain: Normal cerebral volume. No midline shift, ventriculomegaly, mass effect, evidence of mass lesion, intracranial hemorrhage or evidence of cortically based acute infarction. Advanced for age, scattered bilateral cerebral white matter hypodensity, especially in the right corona radiata (series 2, image 17). Some involvement of the left lentiform on that side. Right hemisphere less affected. Brainstem and cerebellum gray-white differentiation within normal limits. Vascular: No suspicious intracranial vascular hyperdensity. Skull: Smooth and benign-appearing left parietal bony exostosis series 4, image 47. This level is not included on the prior neck CT. No other osseous abnormality  identified. Sinuses/Orbits: Visualized paranasal sinuses and mastoids are clear. Other: Visualized orbits and scalp soft tissues are within normal limits. IMPRESSION: 1. Advanced, age indeterminate cerebral white matter changes more pronounced in the left hemisphere. These are nonspecific but most commonly due to small vessel ischemia. 2. No other acute intracranial abnormality. 3. Benign-appearing left parietal bone exostosis (no follow-up imaging recommended). Electronically Signed   By: Odessa Fleming M.D.   On: 01/30/2023 04:13    Microbiology: No results found for this or any previous visit.  Labs: CBC: Recent Labs  Lab 01/30/23 0315 01/30/23 0948  WBC 6.7 11.4*  NEUTROABS 2.8  --   HGB 14.2 13.9  HCT 42.6 41.0  MCV 95.1 93.8  PLT 221 219   Basic Metabolic Panel: Recent Labs  Lab 01/30/23 0315 01/30/23 0948  NA 141  --   K 3.6  --   CL 108  --   CO2 23  --   GLUCOSE 140*  --   BUN 11  --   CREATININE 0.95 0.89  CALCIUM 9.0  --    Liver Function Tests: No results for input(s): "AST", "ALT", "ALKPHOS", "BILITOT", "PROT", "ALBUMIN" in the last 168 hours. CBG: No results for input(s): "GLUCAP" in the last 168 hours.  Discharge time spent: greater than 30  minutes.  Signed: Kendell Bane, MD [facility] 02/01/2023

## 2023-02-01 NOTE — Progress Notes (Addendum)
Rounding Note    Patient Name: Jermaine Shaw Date of Encounter: 02/01/2023  [facility] Cardiologist: None  New consult done by Dr. Tenny Craw Subjective   Patient seen on a.m. rounds.  Denies any chest pain or shortness of breath.  Was up ambulating in the room.  This morning and did have the sensation that his left leg felt heavy but without any tingling or numbness was expressed to nursing staff.  Inpatient Medications    Scheduled Meds:  aspirin EC  81 mg Oral Daily   atorvastatin  40 mg Oral Daily   clopidogrel  75 mg Oral Daily   enoxaparin (LOVENOX) injection  40 mg Subcutaneous Q24H   Continuous Infusions:  PRN Meds: acetaminophen **OR** acetaminophen (TYLENOL) oral liquid 160 mg/5 mL **OR** acetaminophen, hydrALAZINE   Vital Signs    Vitals:   01/31/23 0407 01/31/23 1900 01/31/23 2345 02/01/23 0410  BP: (!) 173/86 (!) 156/93 (!) 178/98 (!) 146/89  Pulse: 79 92 (!) 51 84  Resp: 20 18 18 20   Temp: 98.9 F (37.2 C) 98.7 F (37.1 C) 99 F (37.2 C) 98.7 F (37.1 C)  TempSrc: Oral Oral Oral Oral  SpO2: 98% 96% 100% 99%  Weight:      Height:        Intake/Output Summary (Last 24 hours) at 02/01/2023 1008 Last data filed at 01/31/2023 1700 Gross per 24 hour  Intake 600 ml  Output --  Net 600 ml      01/30/2023   10:39 AM 01/30/2023    2:51 AM 09/25/2013    8:11 PM  Last 3 Weights  Weight (lbs) 156 lb 15.5 oz 160 lb 140 lb  Weight (kg) 71.2 kg 72.576 kg 63.504 kg      Telemetry    Sinus rhythm with rates 70s to 80s- Personally Reviewed  ECG    No new tracings- Personally Reviewed  Physical Exam   GEN: No acute distress.   Neck: No JVD Cardiac: RRR, no murmurs, rubs, or gallops.  Respiratory: Clear to auscultation bilaterally. GI: Soft, nontender, non-distended  MS: No edema; No deformity. Neuro:  Nonfocal  Psych: Normal affect   Labs    High Sensitivity Troponin:  No results for input(s): "TROPONINIHS" in the last 720 hours.    Chemistry Recent Labs  Lab 01/30/23 0315 01/30/23 0948  NA 141  --   K 3.6  --   CL 108  --   CO2 23  --   GLUCOSE 140*  --   BUN 11  --   CREATININE 0.95 0.89  CALCIUM 9.0  --   GFRNONAA >60 >60  ANIONGAP 10  --     Lipids  Recent Labs  Lab 01/31/23 0417  CHOL 161  TRIG 60  HDL 40*  LDLCALC 109*  CHOLHDL 4.0    Hematology Recent Labs  Lab 01/30/23 0315 01/30/23 0948  WBC 6.7 11.4*  RBC 4.48 4.37  HGB 14.2 13.9  HCT 42.6 41.0  MCV 95.1 93.8  MCH 31.7 31.8  MCHC 33.3 33.9  RDW 13.1 12.9  PLT 221 219   Thyroid No results for input(s): "TSH", "FREET4" in the last 168 hours.  BNPNo results for input(s): "BNP", "PROBNP" in the last 168 hours.  DDimer No results for input(s): "DDIMER" in the last 168 hours.   Radiology    ECHOCARDIOGRAM COMPLETE  Result Date: 01/30/2023    ECHOCARDIOGRAM REPORT   Patient Name:   Jermaine Shaw Date of Exam:  01/30/2023 Medical Rec #:  [id number]       Height:       68.0 in Accession #:    [id number]      Weight:       157.0 lb Date of Birth:  [birth year]       BSA:          1.844 m Patient Age:    52 years        BP:           142/89 mmHg Patient Gender: M               HR:           68 bpm. [facility] Procedure: 2D Echo, Color Doppler, Cardiac Doppler and Intracardiac            Opacification Agent Indications:    Stroke I63.9  History:        Patient has no prior history of Echocardiogram examinations.                 CVA; Risk Factors:Current Smoker.  Sonographer:    Aron Baba Referring Phys: [id number] PRATIK D [facility] IMPRESSIONS  1. Left ventricular ejection fraction, by estimation, is 30 to 35%. The left ventricle has moderately decreased function. The left ventricle demonstrates global hypokinesis. Left ventricular diastolic parameters are consistent with Grade I diastolic dysfunction (impaired relaxation).  2. Right ventricular systolic function is normal. The right ventricular size is normal. There is normal pulmonary  artery systolic pressure.  3. The mitral valve is normal in structure. Mild mitral valve regurgitation. No evidence of mitral stenosis.  4. The tricuspid valve is abnormal.  5. The aortic valve is tricuspid. Aortic valve regurgitation is not visualized. No aortic stenosis is present.  6. The inferior vena cava is normal in size with greater than 50% respiratory variability, suggesting right atrial pressure of 3 mmHg. FINDINGS  Left Ventricle: Left ventricular ejection fraction, by estimation, is 30 to 35%. The left ventricle has moderately decreased function. The left ventricle demonstrates global hypokinesis. Definity contrast agent was given IV to delineate the left ventricular endocardial borders. The left ventricular internal cavity size was normal in size. There is no left ventricular hypertrophy. Left ventricular diastolic parameters are consistent with Grade I diastolic dysfunction (impaired relaxation). Normal  left ventricular filling pressure. Right Ventricle: The right ventricular size is normal. Right vetricular wall thickness was not well visualized. Right ventricular systolic function is normal. There is normal pulmonary artery systolic pressure. The tricuspid regurgitant velocity is 2.35 m/s, and with an assumed right atrial pressure of 3 mmHg, the estimated right ventricular systolic pressure is 25.1 mmHg. Left Atrium: Left atrial size was normal in size. Right Atrium: Right atrial size was normal in size. Pericardium: There is no evidence of pericardial effusion. Mitral Valve: The mitral valve is normal in structure. Mild mitral valve regurgitation. No evidence of mitral valve stenosis. Tricuspid Valve: The tricuspid valve is abnormal. Tricuspid valve regurgitation is mild . No evidence of tricuspid stenosis. Aortic Valve: The aortic valve is tricuspid. Aortic valve regurgitation is not visualized. No aortic stenosis is present. Aortic valve mean gradient measures 2.5 mmHg. Aortic valve peak  gradient measures 1.4 mmHg. Aortic valve area, by VTI measures 2.28 cm. Pulmonic Valve: The pulmonic valve was not well visualized. Pulmonic valve regurgitation is not visualized. No evidence of pulmonic stenosis. Aorta: The aortic root is normal in size and structure. Venous: The inferior vena cava  is normal in size with greater than 50% respiratory variability, suggesting right atrial pressure of 3 mmHg. IAS/Shunts: The interatrial septum was not well visualized.  LEFT VENTRICLE PLAX 2D LVIDd:         5.30 cm   Diastology LVIDs:         4.60 cm   LV e' medial:    4.37 cm/s LV PW:         1.00 cm   LV E/e' medial:  17.0 LV IVS:        0.90 cm   LV e' lateral:   7.49 cm/s LVOT diam:     2.10 cm   LV E/e' lateral: 9.9 LV SV:         46 LV SV Index:   25 LVOT Area:     3.46 cm  RIGHT VENTRICLE RV S prime:     8.76 cm/s TAPSE (M-mode): 1.6 cm LEFT ATRIUM           Index        RIGHT ATRIUM           Index LA diam:      2.50 cm 1.36 cm/m   RA Area:     16.20 cm LA Vol (A2C): 42.3 ml 22.94 ml/m  RA Volume:   44.00 ml  23.86 ml/m LA Vol (A4C): 31.8 ml 17.25 ml/m  AORTIC VALVE AV Area (Vmax):    3.99 cm AV Area (Vmean):   1.89 cm AV Area (VTI):     2.28 cm AV Vmax:           58.11 cm/s AV Vmean:          74.401 cm/s AV VTI:            0.202 m AV Peak Grad:      1.4 mmHg AV Mean Grad:      2.5 mmHg LVOT Vmax:         67.00 cm/s LVOT Vmean:        40.700 cm/s LVOT VTI:          0.133 m LVOT/AV VTI ratio: 0.66  AORTA Ao Root diam: 3.90 cm Ao Asc diam:  3.10 cm MITRAL VALVE                TRICUSPID VALVE MV Area (PHT): 3.63 cm     TR Peak grad:   22.1 mmHg MV Decel Time: 209 msec     TR Vmax:        235.00 cm/s MR Peak grad: 73.8 mmHg MR Vmax:      429.50 cm/s   SHUNTS MV E velocity: 74.10 cm/s   Systemic VTI:  0.13 m MV A velocity: 111.00 cm/s  Systemic Diam: 2.10 cm MV E/A ratio:  0.67 Dina Rich MD Electronically signed by Dina Rich MD Signature Date/Time: 01/30/2023/3:32:53 PM    Final    US Carotid  Bilateral  Result Date: 01/30/2023 CLINICAL DATA:  Stroke, left arm numbness EXAM: BILATERAL CAROTID DUPLEX ULTRASOUND TECHNIQUE: Wallace Cullens scale imaging, color Doppler and duplex ultrasound were performed of bilateral carotid and vertebral arteries in the neck. COMPARISON:  CT 09/25/2013 FINDINGS: Criteria: Quantification of carotid stenosis is based on velocity parameters that correlate the residual internal carotid diameter with NASCET-based stenosis levels, using the diameter of the distal internal carotid lumen as the denominator for stenosis measurement. The following velocity measurements were obtained: RIGHT ICA: 126/40 cm/sec CCA: 82/13 cm/sec SYSTOLIC ICA/CCA RATIO:  1.5 ECA: 72 cm/sec  LEFT ICA: 80/20 cm/sec CCA: 73/20 cm/sec SYSTOLIC ICA/CCA RATIO:  1.1 ECA: 63 cm/sec RIGHT CAROTID ARTERY: Mild smooth plaque in the distal common carotid artery without significant stenosis. Normal waveforms and color Doppler signal throughout. RIGHT VERTEBRAL ARTERY:  Normal flow direction and waveform. LEFT CAROTID ARTERY: Partially calcified plaque in the proximal left ECA resulting in only mild short-segment stenosis. Normal waveforms and color Doppler signal throughout. LEFT VERTEBRAL ARTERY:  Normal flow direction and waveform. IMPRESSION: 1. Bilateral carotid bifurcation plaque resulting in less than 50% diameter ICA stenosis. 2. Antegrade bilateral vertebral arterial flow. Electronically Signed   By: Corlis Leak M.D.   On: 01/30/2023 14:21    Cardiac Studies   Echo 01/30/23   1. Left ventricular ejection fraction, by estimation, is 30 to 35%. The  left ventricle has moderately decreased function. The left ventricle  demonstrates global hypokinesis. Left ventricular diastolic parameters are  consistent with Grade I diastolic  dysfunction (impaired relaxation).   2. Right ventricular systolic function is normal. The right ventricular  size is normal. There is normal pulmonary artery systolic pressure.   3. The  mitral valve is normal in structure. Mild mitral valve  regurgitation. No evidence of mitral stenosis.   4. The tricuspid valve is abnormal.   5. The aortic valve is tricuspid. Aortic valve regurgitation is not  visualized. No aortic stenosis is present.   6. The inferior vena cava is normal in size with greater than 50%  respiratory variability, suggesting right atrial pressure of 3 mmHg.     Patient Profile     52 y.o. male with a past medical history of tobacco abuse, acute CVA, who has been seen and evaluated for HFrEF.  Assessment & Plan    HFrEF found on recent echocardiogram -LVEF of 30-35% on echocardiogram -Unclear etiology -Escalate GDMT as allowed -Will need close outpatient follow-up with repeat echo, blood pressure control, and potential ischemic workup -Heart failure education -Daily weights, I's and O's, low-sodium diet  Acute ischemic CVA -Previously was followed by neurology -Aspirin 81 mg daily and clopidogrel 75 mg daily for 3 weeks, and then aspirin 81 mg daily monotherapy -Continue on atorvastatin -Recommend 30-day event monitor with ZIO AT on discharge -Permissive hypertension timeframe is completed per neurology -Patient stable from neurology standpoint for discharge -No further recommendations from PT/OT  Essential hypertension -Blood pressure 146/89 -Vital signs per unit protocol  Tobacco abuse -Total cessation is recommended     For questions or updates, please contact [facility] Please consult [email or web address] for contact info under        Signed, SHERI HAMMOCK, NP  02/01/2023, 10:08 AM    Patient seen and examined   I agree with findings as noted by S Hammock above Pt is comfortable  Volume status overall appears good  On exam: Neck   JVP is normal  Lungs are clear Cardiac RRr   No  S3  no murmurs Ext without edema   Neuro  Deferred   HFrEF   LVEF depressed.  I think it is a little better than reported on echo   Patient  without CP history  Volume status is OK  Currently not on any GDMT per neuro wanting permissive HTN IF OK to, I would recomm  entresto 24/26 and Toprol XL 25 mg daily    Meds can be titrated as outpt but I would start here.  2  CVA   Pt to have event monitor  3  Tob  Counselled  on cessation  Will make sure that he has outpt follow up

## 2023-02-01 NOTE — TOC Benefit Eligibility Note (Signed)
Patient Product/process development scientist completed.    The patient is currently admitted and upon discharge could be taking Entresto 24-26 mg.  The current 30 day co-pay is $65.00.   The patient is currently admitted and upon discharge could be taking Farxiga 10 mg.  The current 30 day co-pay is $0.00.   The patient is currently admitted and upon discharge could be taking Jardiance 10 mg.  The current 30 day co-pay is $0.00.   The patient is insured through [employer]   This test claim was processed through [employer]- copay amounts may vary at other pharmacies due to [employer], or as the patient moves through the different stages of their insurance plan.  Roland Earl, CPHT Pharmacy Patient Advocate Specialist [facility] Patient Advocate Team Direct Number: [phone]  Fax: [phone]

## 2023-02-01 NOTE — Telephone Encounter (Signed)
Pharmacy Patient Advocate Encounter  Insurance verification completed.    The patient is insured through [employer]   The patient is currently admitted and ran test claims for the following: Jermaine Shaw, Jardiance.  Copays and coinsurance results were relayed to Inpatient clinical team.

## 2023-02-15 ENCOUNTER — Encounter: Payer: Self-pay | Admitting: Nurse Practitioner

## 2023-02-15 ENCOUNTER — Ambulatory Visit: Payer: BC Managed Care – PPO | Attending: Nurse Practitioner | Admitting: Nurse Practitioner

## 2023-02-15 VITALS — BP 162/88 | HR 79 | Ht 70.0 in | Wt 158.2 lb

## 2023-02-15 DIAGNOSIS — I5020 Unspecified systolic (congestive) heart failure: Secondary | ICD-10-CM

## 2023-02-15 DIAGNOSIS — Z79899 Other long term (current) drug therapy: Secondary | ICD-10-CM | POA: Diagnosis not present

## 2023-02-15 DIAGNOSIS — I639 Cerebral infarction, unspecified: Secondary | ICD-10-CM

## 2023-02-15 DIAGNOSIS — I10 Essential (primary) hypertension: Secondary | ICD-10-CM

## 2023-02-15 DIAGNOSIS — Z720 Tobacco use: Secondary | ICD-10-CM

## 2023-02-15 MED ORDER — SPIRONOLACTONE 25 MG PO TABS: 12.5000 mg | ORAL_TABLET | Freq: Every day | ORAL | 2 refills | Status: DC

## 2023-02-15 MED ORDER — BLOOD PRESSURE MONITOR MISC: 1.0000 | 0 refills | Status: AC

## 2023-02-15 MED ORDER — DAPAGLIFLOZIN PROPANEDIOL 10 MG PO TABS: 10.0000 mg | ORAL_TABLET | Freq: Every day | ORAL | 6 refills | Status: DC

## 2023-02-15 NOTE — Patient Instructions (Addendum)
Medication Instructions:  Your physician has recommended you make the following change in your medication:  Start farxiga 10 mg daily-take 30 day free voucher to pharmacy Start spironolactone 12.5 mg daily Continue all other medications the same  Labwork: BMET in 1 week (02/22/2023) Non-fasting [facility] ([address]. [place])  Testing/Procedures: Your physician has recommended that you wear an event monitor for 30 days. Event monitors are medical devices that record the heart's electrical activity. Doctors most often [place] these monitors to diagnose arrhythmias. Arrhythmias are problems with the speed or rhythm of the heartbeat. The monitor is a small, portable device. You can wear one while you do your normal daily activities. This is usually used to diagnose what is causing palpitations/syncope (passing out). [employer] Conservation officer, historic buildings) will mail this to your home address.  Follow-Up: Your physician recommends that you schedule a follow-up appointment in: 4-6 weeks  Any Other Special Instructions Will Be Listed Below (If Applicable). Your physician has requested that you regularly monitor and record your blood pressure readings at home. Please use the same machine at the same time of day to check your readings and record them to bring to your follow-up visit. Your top blood pressure goal is 130 or better.   If you need a refill on your cardiac medications before your next appointment, please call your pharmacy.

## 2023-02-15 NOTE — Progress Notes (Signed)
Office Visit    Patient Name: [name] Date of Encounter: 02/15/2023  PCP:  [facility]   [facility]  Cardiologist:  Dietrich Pates, MD  Advanced Practice Provider:  Sharlene Dory, NP Electrophysiologist:  None   Chief Complaint    Jermaine Shaw is a 52 y.o. male with a hx of ischemic stroke, hyperlipidemia, HFrEF, HTN, and tobacco abuse, who presents today for hospital follow-up.  Past Medical History    Past Medical History:  Diagnosis Date   Acute ischemic stroke (HCC)    Heart failure with reduced ejection fraction (HCC)    Hyperlipidemia    Left-sided weakness    Past Surgical History:  Procedure Laterality Date   BACK SURGERY      Allergies  No Known Allergies  History of Present Illness    Jermaine Shaw is a 52 y.o. male with a PMH as mentioned above.  Admitted in May 2024 with acute ischemic CVA, neuro and cardiology were consulted.  Echocardiogram showed newly diagnosed HFrEF with severe LV dysfunction.  Echocardiogram revealed EF 30 to 35%.  Entresto and Toprol-XL were added to medication regimen.  Cardiology recommended DAPT with aspirin and Plavix daily for 3 weeks, then followed by aspirin daily.  Recommending 30-day event monitor to look for A-fib at discharge.  Was also started on atorvastatin.  Today he presents for hospital follow-up.  He states he continues to note left side numbness after stroke. Overall doing well from a cardiac perspective. Denies any chest pain, shortness of breath, palpitations, syncope, presyncope, dizziness, orthopnea, PND, swelling or significant weight changes, acute bleeding, or claudication. Admits to rare Etoh use, he is weaning tobacco use from 1 PPD -> 6 cigarettes/day.   FH: sister has had hx of brain surgery, no other FH of CVD  EKGs/Labs/Other Studies Reviewed:   The following studies were reviewed today:   EKG:  EKG is not ordered today.    Echo 01/2023: 1. Left  ventricular ejection fraction, by estimation, is 30 to 35%. The  left ventricle has moderately decreased function. The left ventricle  demonstrates global hypokinesis. Left ventricular diastolic parameters are  consistent with Grade I diastolic  dysfunction (impaired relaxation).   2. Right ventricular systolic function is normal. The right ventricular  size is normal. There is normal pulmonary artery systolic pressure.   3. The mitral valve is normal in structure. Mild mitral valve  regurgitation. No evidence of mitral stenosis.   4. The tricuspid valve is abnormal.   5. The aortic valve is tricuspid. Aortic valve regurgitation is not  visualized. No aortic stenosis is present.   6. The inferior vena cava is normal in size with greater than 50%  respiratory variability, suggesting right atrial pressure of 3 mmHg.  Carotid duplex 01/2023: IMPRESSION: 1. Bilateral carotid bifurcation plaque resulting in less than 50% diameter ICA stenosis. 2. Antegrade bilateral vertebral arterial flow.  Recent Labs: 01/30/2023: BUN 11; Creatinine, Ser 0.89; Hemoglobin 13.9; Platelets 219; Potassium 3.6; Sodium 141  Recent Lipid Panel    Component Value Date/Time   CHOL 161 01/31/2023 0417   TRIG 60 01/31/2023 0417   HDL 40 (L) 01/31/2023 0417   CHOLHDL 4.0 01/31/2023 0417   VLDL 12 01/31/2023 0417   LDLCALC 109 (H) 01/31/2023 0417    Home Medications   Current Meds  Medication Sig   aspirin EC 81 MG tablet Take 1 tablet (81 mg total) by mouth daily. Swallow whole.  atorvastatin (LIPITOR) 40 MG tablet Take 1 tablet (40 mg total) by mouth daily.   Blood Pressure Monitor MISC 1 each by Does not apply route as directed. Adult BP monitor dx: hypertension & stroke   clopidogrel (PLAVIX) 75 MG tablet Take 1 tablet (75 mg total) by mouth daily for 21 days.   dapagliflozin propanediol (FARXIGA) 10 MG TABS tablet Take 1 tablet (10 mg total) by mouth daily before breakfast.   famotidine (PEPCID) 40 MG  tablet Take 1 tablet (40 mg total) by mouth every evening.   metoprolol succinate (TOPROL-XL) 25 MG 24 hr tablet Take 1 tablet (25 mg total) by mouth 2 (two) times daily with a meal.   sacubitril-valsartan (ENTRESTO) 24-26 MG Take 1 tablet by mouth 2 (two) times daily.   spironolactone (ALDACTONE) 25 MG tablet Take 0.5 tablets (12.5 mg total) by mouth daily.     Review of Systems   All other systems reviewed and are otherwise negative except as noted above.  Physical Exam    VS:  BP (!) 162/88   Pulse 79   Ht 5\' 10"  (1.778 m)   Wt 158 lb 3.2 oz (71.8 kg)   SpO2 99%   BMI 22.70 kg/m  , BMI Body mass index is 22.7 kg/m.  Wt Readings from Last 3 Encounters:  02/16/23 158 lb 1.1 oz (71.7 kg)  02/15/23 158 lb 3.2 oz (71.8 kg)  01/30/23 156 lb 15.5 oz (71.2 kg)     GEN: Well nourished, well developed, in no acute distress. HEENT: normal. Neck: Supple, no JVD, carotid bruits, or masses. Cardiac: S1/S2, RRR, no murmurs, rubs, or gallops. No clubbing, cyanosis, edema.  Radials/PT 2+ and equal bilaterally.  Respiratory:  Respirations regular and unlabored, clear to auscultation bilaterally. GI: Soft, nontender, nondistended. MS: No deformity or atrophy. Skin: Warm and dry, no rash. Neuro:  Strength and sensation are intact. Psych: Normal affect.  Assessment & Plan    HFrEF, medication management Stage C, NYHA class I symptoms. Etiology unclear. Euvolemic and well compensated on exam. Continue Entresto and and Toprol XL. Will begin Farxiga and Aldactone. Will obtain BMET in 1 week. Low sodium diet, fluid restriction <2L, and daily weights encouraged. Educated to contact our office for weight gain of 2 lbs overnight or 5 lbs in one week. Plan to update Echo once GDMT is optimal.   2. Ischemic stroke Continues to admit to upper left sided numbness since stroke, denies any other new symptoms or worsening symptoms. Continue ASA, atorvastatin, and Plavix. At next visit, will discuss  obtaining FLP and LFT per protocol. Heart healthy diet and regular cardiovascular exercise encouraged. Will arrange 30 day monitor as previously recommended to evaluate for A-fib as cause for ischemic stroke.   3. HTN BP on arrival 162/88, repeat BP 165/80. Not at goal. Does not check BP at home. Continue Entresto and Toprol XL, will start Aldactone. Will repeat BMET in 1 week. Discussed to monitor BP at home at least 2 hours after medications and sitting for 5-10 minutes. Will write Rx for BP cuff. Given BP log and salty six. Heart healthy diet and regular cardiovascular exercise encouraged.   4. Tobacco abuse Smoking cessation encouraged and discussed.    Disposition: Follow up in 4-6 week(s) with Dietrich Pates, MD or APP.  Signed, Sharlene Dory, NP 02/17/2023, 2:46 PM [facility]

## 2023-02-16 ENCOUNTER — Emergency Department (HOSPITAL_COMMUNITY): Payer: BC Managed Care – PPO

## 2023-02-16 ENCOUNTER — Encounter (HOSPITAL_COMMUNITY): Payer: BC Managed Care – PPO | Admitting: Speech Pathology

## 2023-02-16 ENCOUNTER — Emergency Department (HOSPITAL_COMMUNITY)
Admission: EM | Admit: 2023-02-16 | Discharge: 2023-02-16 | Disposition: A | Discharge status: Home / Self Care | Payer: BC Managed Care – PPO | Attending: Emergency Medicine | Admitting: Emergency Medicine

## 2023-02-16 ENCOUNTER — Other Ambulatory Visit: Payer: Self-pay

## 2023-02-16 DIAGNOSIS — Z79899 Other long term (current) drug therapy: Secondary | ICD-10-CM | POA: Diagnosis not present

## 2023-02-16 DIAGNOSIS — Z7982 Long term (current) use of aspirin: Secondary | ICD-10-CM | POA: Insufficient documentation

## 2023-02-16 DIAGNOSIS — R519 Headache, unspecified: Secondary | ICD-10-CM

## 2023-02-16 DIAGNOSIS — Z7902 Long term (current) use of antithrombotics/antiplatelets: Secondary | ICD-10-CM | POA: Insufficient documentation

## 2023-02-16 DIAGNOSIS — I10 Essential (primary) hypertension: Secondary | ICD-10-CM | POA: Insufficient documentation

## 2023-02-16 MED ORDER — HYDROCODONE-ACETAMINOPHEN 5-325 MG PO TABS
1.0000 | ORAL_TABLET | Freq: Once | ORAL | Status: AC
  Administered 2023-02-16: 1 via ORAL
  Filled 2023-02-16: qty 1

## 2023-02-16 NOTE — ED Provider Notes (Signed)
EMERGENCY DEPARTMENT AT [facility] Provider Note   CSN: [id number] Arrival date & time: 02/16/23  0139     History  Chief Complaint  Patient presents with   Headache    Jermaine Shaw is a 52 y.o. male.  Patient presents to the emergency department for evaluation of headache.  Patient concerned because he had a stroke a month ago.  He has been started on new medication since that time and is worried that the medications are causing this.  Patient reports that he had a headache earlier today, it eased off and then it came back.  He also feels like there may be a lump on the right side of his throat.       Home Medications Prior to Admission medications   Medication Sig Start Date End Date Taking? Authorizing Provider  aspirin EC 81 MG tablet Take 1 tablet (81 mg total) by mouth daily. Swallow whole. 02/02/23   Shahmehdi, Gemma Payor, MD  atorvastatin (LIPITOR) 40 MG tablet Take 1 tablet (40 mg total) by mouth daily. 02/02/23   ShahmehdiGemma Payor, MD  Blood Pressure Monitor MISC 1 each by Does not apply route as directed. Adult BP monitor dx: hypertension & stroke 02/15/23   Sharlene Dory, NP  clopidogrel (PLAVIX) 75 MG tablet Take 1 tablet (75 mg total) by mouth daily for 21 days. 02/02/23 02/23/23  Kendell Bane, MD  dapagliflozin propanediol (FARXIGA) 10 MG TABS tablet Take 1 tablet (10 mg total) by mouth daily before breakfast. 02/15/23   Sharlene Dory, NP  famotidine (PEPCID) 40 MG tablet Take 1 tablet (40 mg total) by mouth every evening. 02/01/23 02/01/24  Shahmehdi, Gemma Payor, MD  metoprolol succinate (TOPROL-XL) 25 MG 24 hr tablet Take 1 tablet (25 mg total) by mouth 2 (two) times daily with a meal. 02/01/23 05/02/23  Shahmehdi, Gemma Payor, MD  sacubitril-valsartan (ENTRESTO) 24-26 MG Take 1 tablet by mouth 2 (two) times daily. 02/01/23   Shahmehdi, Gemma Payor, MD  spironolactone (ALDACTONE) 25 MG tablet Take 0.5 tablets (12.5 mg total) by mouth daily. 02/15/23   Sharlene Dory, NP      Allergies    Patient has no known allergies.    Review of Systems   Review of Systems  Physical Exam Updated Vital Signs BP (!) 170/99   Pulse 81   Temp 99.9 F (37.7 C)   Resp 19   Ht 5\' 10"  (1.778 m)   Wt 71.7 kg   SpO2 99%   BMI 22.68 kg/m  Physical Exam Vitals and nursing note reviewed.  Constitutional:      General: He is not in acute distress.    Appearance: He is well-developed.  HENT:     Head: Normocephalic and atraumatic.     Mouth/Throat:     Mouth: Mucous membranes are moist.  Eyes:     General: Vision grossly intact. Gaze aligned appropriately.     Extraocular Movements: Extraocular movements intact.     Conjunctiva/sclera: Conjunctivae normal.  Cardiovascular:     Rate and Rhythm: Normal rate and regular rhythm.     Pulses: Normal pulses.     Heart sounds: Normal heart sounds, S1 normal and S2 normal. No murmur heard.    No friction rub. No gallop.  Pulmonary:     Effort: Pulmonary effort is normal. No respiratory distress.     Breath sounds: Normal breath sounds.  Abdominal:     Palpations: Abdomen is soft.  Tenderness: There is no abdominal tenderness. There is no guarding or rebound.     Hernia: No hernia is present.  Musculoskeletal:        General: No swelling.     Cervical back: Full passive range of motion without pain, normal range of motion and neck supple. No pain with movement, spinous process tenderness or muscular tenderness. Normal range of motion.     Right lower leg: No edema.     Left lower leg: No edema.  Lymphadenopathy:     Cervical: No cervical adenopathy.  Skin:    General: Skin is warm and dry.     Capillary Refill: Capillary refill takes less than 2 seconds.     Findings: No ecchymosis, erythema, lesion or wound.  Neurological:     Mental Status: He is alert and oriented to person, place, and time.     GCS: GCS eye subscore is 4. GCS verbal subscore is 5. GCS motor subscore is 6.     Cranial  Nerves: Cranial nerves 2-12 are intact.     Sensory: Sensation is intact.     Motor: Motor function is intact. No weakness or abnormal muscle tone.     Coordination: Coordination is intact.  Psychiatric:        Mood and Affect: Mood normal.        Speech: Speech normal.        Behavior: Behavior normal.     ED Results / Procedures / Treatments   Labs (all labs ordered are listed, but only abnormal results are displayed) Labs Reviewed - No data to display  EKG None  Radiology CT HEAD WO CONTRAST ( )  Result Date: 02/16/2023 CLINICAL DATA:  New onset headaches EXAM: CT HEAD WITHOUT CONTRAST TECHNIQUE: Contiguous axial images were obtained from the base of the skull through the vertex without intravenous contrast. RADIATION DOSE REDUCTION: This exam was performed according to the departmental dose-optimization program which includes automated exposure control, adjustment of the mA and/or kV according to patient size and/or use of iterative reconstruction technique. COMPARISON:  01/30/2023 FINDINGS: Brain: No evidence of acute infarction, hemorrhage, hydrocephalus, extra-axial collection or mass lesion/mass effect. Lacunar infarct is noted in the deep white matter on the left stable in appearance from the prior exam. Vascular: No hyperdense vessel or unexpected calcification. Skull: Normal. Negative for fracture or focal lesion. Stable exostosis is noted in the left parietal bone posteriorly. Sinuses/Orbits: No acute finding. Other: None. IMPRESSION: Chronic ischemic changes without acute abnormality. Electronically Signed   By: Alcide Clever M.D.   On: 02/16/2023 02:52    Procedures Procedures    Medications Ordered in ED Medications  HYDROcodone-acetaminophen (NORCO/VICODIN) 5-325 MG per tablet 1 tablet (has no administration in time range)    ED Course/ Medical Decision Making/ A&P                             Medical Decision Making Amount and/or Complexity of Data  Reviewed Radiology: ordered.  Risk Prescription drug management.   Differential Diagnosis considered includes, but not limited to: Hypertensive emergencies, ISpace occupying lesions (tumors, abscesses, cysts), Acute hydrocephalus, Intracranial hemorrhage, Cerebrovascular accident or stroke, Meningitis and encephalitis, Migraine HA, Tension HA.  Patient appears well.  He is hypertensive but otherwise vital signs unremarkable.  He is in no distress.  I do not appreciate any masses on his neck exam.  No overlying skin changes, no fullness.  Oropharyngeal exam is normal, no  posterior oropharyngeal erythema or abscess.  No obvious dental abscesses or tender dentalgia.  Patient's neurologic exam is unremarkable.  CT head performed, no acute abnormality noted.  Patient reassured.         Final Clinical Impression(s) / ED Diagnoses Final diagnoses:  Primary hypertension  Bad headache    Rx / DC Orders ED Discharge Orders     None         Stephanny Tsutsui, Canary Brim, MD 02/16/23 [phone]

## 2023-02-16 NOTE — ED Triage Notes (Signed)
Pt c/o headache that started around 2300 last night, was started on spironolactone today and is worried that may be causing HA.  Pt reports he had stroke approx. 1 month ago.

## 2023-02-21 DIAGNOSIS — I639 Cerebral infarction, unspecified: Secondary | ICD-10-CM

## 2023-02-22 ENCOUNTER — Ambulatory Visit: Payer: BC Managed Care – PPO | Attending: Nurse Practitioner

## 2023-02-22 DIAGNOSIS — I639 Cerebral infarction, unspecified: Secondary | ICD-10-CM

## 2023-02-23 LAB — BASIC METABOLIC PANEL
BUN/Creatinine Ratio: 10 (ref 9–20)
BUN: 12 mg/dL (ref 6–24)
CO2: 22 mmol/L (ref 20–29)
Calcium: 10.3 mg/dL — ABNORMAL HIGH (ref 8.7–10.2)
Chloride: 103 mmol/L (ref 96–106)
Creatinine, Ser: 1.15 mg/dL (ref 0.76–1.27)
Glucose: 149 mg/dL — ABNORMAL HIGH (ref 70–99)
Potassium: 3.8 mmol/L (ref 3.5–5.2)
Sodium: 143 mmol/L (ref 134–144)
eGFR: 76 mL/min/{1.73_m2} (ref >59)

## 2023-03-03 ENCOUNTER — Telehealth: Payer: Self-pay | Admitting: *Deleted

## 2023-03-03 NOTE — Telephone Encounter (Signed)
Lesle Chris, LPN 4/40/3474 25:95 AM EDT Back to Top    Notified, copy to pcp.

## 2023-03-03 NOTE — Telephone Encounter (Signed)
-----   Message from Sharlene Dory, NP sent at 03/01/2023  2:11 PM EDT ----- Loney Laurence looks good. Continue same treatment plan.   Sharlene Dory, AGNP-C

## 2023-03-05 ENCOUNTER — Telehealth: Payer: Self-pay | Admitting: Cardiology

## 2023-03-05 NOTE — Telephone Encounter (Signed)
   Received a page from [employer] who is monitoring patient's 30-day event device.  I called and spoke with the representative who advised the patient had ~7 seconds of ventricular tachycardia.  This was preceded by and followed by normal sinus rhythm with a rate of about 60 to 70 bpm per representative. I requested a fax copy of this event monitor tracing.  On personal review, this shows 3 beat run of NSVT followed by ventricular couplets/accelerated ventricular rhythm over about a 7-second period. Rhythm also reviewed with Dr. Royann Shivers. Will continue to monitor. See tracing below.  Perlie Gold, PA-C

## 2023-03-08 ENCOUNTER — Encounter: Payer: Self-pay | Admitting: Neurology

## 2023-03-08 ENCOUNTER — Encounter: Payer: Self-pay | Admitting: Anesthesiology

## 2023-03-08 ENCOUNTER — Ambulatory Visit: Payer: BC Managed Care – PPO | Admitting: Neurology

## 2023-03-08 VITALS — BP 129/69 | HR 82 | Ht 70.0 in | Wt 151.8 lb

## 2023-03-08 DIAGNOSIS — I6381 Other cerebral infarction due to occlusion or stenosis of small artery: Secondary | ICD-10-CM

## 2023-03-08 DIAGNOSIS — F17210 Nicotine dependence, cigarettes, uncomplicated: Secondary | ICD-10-CM

## 2023-03-08 DIAGNOSIS — I6350 Cerebral infarction due to unspecified occlusion or stenosis of unspecified cerebral artery: Secondary | ICD-10-CM | POA: Diagnosis not present

## 2023-03-08 DIAGNOSIS — R202 Paresthesia of skin: Secondary | ICD-10-CM | POA: Diagnosis not present

## 2023-03-08 NOTE — Progress Notes (Signed)
[facility] [address] [place]. [place] [address] [phone]       OFFICE CONSULT NOTE  Jermaine Shaw Date of Birth:  May 14, 1971 Medical Record Number:  [id number]   Referring MD:  Lindie Spruce  Reason for Referral:  Stroke  HPI: Jermaine Shaw is a 52 year old African-American male seen today for initial office consultation visit for stroke.  History is obtained from the patient and review of electronic medical records and I personally reviewed pertinent available imaging films in PACS.  He has past medical history of hyperlipidemia and congestive heart failure.  He presented to [facility] on 01/30/2023 after waking up from sleep at 1:15 AM and noticing that left side of the body felt numb as if it is gone to sleep.  When seen in the emergency room his blood pressure was elevated at 195/90.  He presented outside time window for thrombolysis.  CT head on admission was unremarkable but MRI scan showed a small punctate right paramedian pontine infarct as well as a subacute left corona radiata lacunar infarct as well.  MR angiogram of the brain showed no large vessel stenosis but mild intracranial atherosclerosis.  There was a small 3 to 4 mm distal right ICA saccular infundibulum versus small aneurysm.  MRI scan cervical spine showed mild disc degenerative changes and spinal stenosis at C3-4 and C 70 5-6 but no significant compression.  LDL cholesterol was 109 mg percent.  Hemoglobin A1c 6.2.  Echocardiogram showed reduced ejection fraction of 30 to 35% with global hypokinesis.  Left atrial size was normal.  Carotid ultrasound showed bilateral less than 50% ICA stenosis.  Patient was started on Plavix for 3 weeks followed by aspirin alone.  He states he is doing well.  He still has some mild intermittent paresthesias in the left side which have not completely resolved.  In fact saw his PCP recently for 4 paresthesias and was given a trial of muscle relaxants which did  not help.  He is also followed up with a cardiologist for his heart failure.  He is currently wearing a 30-day heart monitor which he has to return next week.  He has cut back smoking from 1 pack/day to now 6 cigarettes a day and plans to eventually quit.  Does not drink alcohol.  He has chronic left shoulder pain following child shoulder surgery.  States he is walking good but occasionally drags left leg when he gets tired.  He is tolerating aspirin well without bruising or bleeding and Lipitor without muscle aches and pains.  He states his blood pressure is under good control.  ROS:   14 system review of systems is positive for numbness, weakness, shoulder pain all other systems negative  PMH:  Past Medical History:  Diagnosis Date   Acute ischemic stroke (HCC)    Heart failure with reduced ejection fraction (HCC)    Hyperlipidemia    Left-sided weakness     Social History:  Social History   Socioeconomic History   Marital status: Married    Spouse name: Not on file   Number of children: Not on file   Years of education: Not on file   Highest education level: Not on file  Occupational History   Not on file  Tobacco Use   Smoking status: Every Day    Packs/day: .5    Types: Cigarettes   Smokeless tobacco: Not on file  Vaping Use   Vaping Use: Never used  Substance and Sexual  Activity   Alcohol use: Yes   Drug use: No   Sexual activity: Yes  Other Topics Concern   Not on file  Social History Narrative   Not on file   Social Determinants of Health   Financial Resource Strain: Not on file  Food Insecurity: Not on file  Transportation Needs: Not on file  Physical Activity: Not on file  Stress: Not on file  Social Connections: Not on file  Intimate Partner Violence: Not on file    Medications:   Current Outpatient Medications on File Prior to Visit  Medication Sig Dispense Refill   aspirin EC 81 MG tablet Take 1 tablet (81 mg total) by mouth daily. Swallow whole. 30  tablet 12   atorvastatin (LIPITOR) 40 MG tablet Take 1 tablet (40 mg total) by mouth daily. 30 tablet 3   Blood Pressure Monitor MISC 1 each by Does not apply route as directed. Adult BP monitor dx: hypertension & stroke 1 each 0   dapagliflozin propanediol (FARXIGA) 10 MG TABS tablet Take 1 tablet (10 mg total) by mouth daily before breakfast. 30 tablet 6   famotidine (PEPCID) 40 MG tablet Take 1 tablet (40 mg total) by mouth every evening. 30 tablet 1   metoprolol tartrate (LOPRESSOR) 25 MG tablet Take 25 mg by mouth daily.     sacubitril-valsartan (ENTRESTO) 24-26 MG Take 1 tablet by mouth 2 (two) times daily. 60 tablet 2   spironolactone (ALDACTONE) 25 MG tablet Take 0.5 tablets (12.5 mg total) by mouth daily. 45 tablet 2   metoprolol succinate (TOPROL-XL) 25 MG 24 hr tablet Take 1 tablet (25 mg total) by mouth 2 (two) times daily with a meal. (Patient not taking: Reported on 03/08/2023) 60 tablet 2   No current facility-administered medications on file prior to visit.    Allergies:  No Known Allergies  Physical Exam General: well developed, well nourished pleasant middle-aged African-American male, seated, in no evident distress Head: head normocephalic and atraumatic.   Neck: supple with no carotid or supraclavicular bruits Cardiovascular: regular rate and rhythm, no murmurs Musculoskeletal: no deformity.  Left shoulder deformity s/p surgery. Skin:  no rash/petichiae.  Scar left shoulder from prior surgery Vascular:  Normal pulses all extremities  Neurologic Exam Mental Status: Awake and fully alert. Oriented to place and time. Recent and remote memory intact. Attention span, concentration and fund of knowledge appropriate. Mood and affect appropriate.  Cranial Nerves: Fundoscopic exam reveals sharp disc margins. Pupils equal, briskly reactive to light. Extraocular movements full without nystagmus. Visual fields full to confrontation. Hearing intact. Facial sensation intact. Face,  tongue, palate moves normally and symmetrically.  Motor: Normal bulk and tone. Normal strength in all tested extremity muscles.  Diminished fine finger movements on the left.  Orbits right over left upper extremity.  Left shoulder elevation limited due to mechanical restriction from prior surgery and chronic pain Sensory.: intact to touch , pinprick , position and vibratory sensation.  Subjective paresthesias on the left but no objective sensory loss Coordination: Rapid alternating movements normal in all extremities. Finger-to-nose and heel-to-shin performed accurately bilaterally. Gait and Station: Arises from chair without difficulty. Stance is normal.  Slow cautious gait using the cane.  Able to heel, toe and not able to tandem walk without difficulty.  Reflexes: 1+ and symmetric. Toes downgoing.   NIHSS  0 Modified Rankin  1   ASSESSMENT: 52 year old African-American male with left body paresthesias from right pontine lacunar stroke as well as subacute left corona radiator  lacunar stroke in May 2024 due to small vessel disease.  Vascular risk factors of hypertension, hyperlipidemia, smoking, cardiomyopathy and borderline diabetes.     PLAN:I had a long d/w patient about his recent  lacunar stroke, poststroke paresthesias, risk for recurrent stroke/TIAs, personally independently reviewed imaging studies and stroke evaluation results and answered questions.Continue aspirin 81 mg daily  for secondary stroke prevention and maintain strict control of hypertension with blood pressure goal below 130/90, diabetes with hemoglobin A1c goal below 6.5% and lipids with LDL cholesterol goal below 70 mg/dL. I also advised the patient to eat a healthy diet with plenty of whole grains, cereals, fruits and vegetables, exercise regularly and maintain ideal body weight.His  paresthesias are not disabling enough at the present time to justify trial of medications like Topamax and gabapentin.  I advised him to quit  smoking completely he is agreeable.  Followup in the future with my nurse practitioner in 3 months or call earlier if necessary.  Greater than 50% time during this 45-minute consultation visit was spent on counseling and coordination about lacunar stroke and post stroke paresthesias questions  Delia Heady, MD Note: This document was prepared with digital dictation and possible smart phrase technology. Any transcriptional errors that result from this process are unintentional.

## 2023-03-08 NOTE — Patient Instructions (Addendum)
I had a long d/w patient about his recent  lacunar stroke, poststroke paresthesias, risk for recurrent stroke/TIAs, personally independently reviewed imaging studies and stroke evaluation results and answered questions.Continue aspirin 81 mg daily  for secondary stroke prevention and maintain strict control of hypertension with blood pressure goal below 130/90, diabetes with hemoglobin A1c goal below 6.5% and lipids with LDL cholesterol goal below 70 mg/dL. I also advised the patient to eat a healthy diet with plenty of whole grains, cereals, fruits and vegetables, exercise regularly and maintain ideal body weight.His  paresthesias are not disabling enough at the present time to justify trial of medications like Topamax and gabapentin.  I advised him to quit smoking completely he is agreeable.  Followup in the future with my nurse practitioner in 3 months or call earlier if necessary.  Stroke Prevention Some medical conditions and behaviors can lead to a higher chance of having a stroke. You can help prevent a stroke by eating healthy, exercising, not smoking, and managing any medical conditions you have. Stroke is a leading cause of functional impairment. Primary prevention is particularly important because a majority of strokes are first-time events. Stroke changes the lives of not only those who experience a stroke but also their family and other caregivers. How can this condition affect me? A stroke is a medical emergency and should be treated right away. A stroke can lead to brain damage and can sometimes be life-threatening. If a person gets medical treatment right away, there is a better chance of surviving and recovering from a stroke. What can increase my risk? The following medical conditions may increase your risk of a stroke: Cardiovascular disease. High blood pressure (hypertension). Diabetes. High cholesterol. Sickle cell disease. Blood clotting disorders (hypercoagulable  state). Obesity. Sleep disorders (obstructive sleep apnea). Other risk factors include: Being older than age 63. Having a history of blood clots, stroke, or mini-stroke (transient ischemic attack, TIA). Genetic factors, such as race, ethnicity, or a family history of stroke. Smoking cigarettes or using other tobacco products. Taking birth control pills, especially if you also use tobacco. Heavy use of alcohol or drugs, especially cocaine and methamphetamine. Physical inactivity. What actions can I take to prevent this? Manage your health conditions High cholesterol levels. Eating a healthy diet is important for preventing high cholesterol. If cholesterol cannot be managed through diet alone, you may need to take medicines. Take any prescribed medicines to control your cholesterol as told by your health care provider. Hypertension. To reduce your risk of stroke, try to keep your blood pressure below 130/80. Eating a healthy diet and exercising regularly are important for controlling blood pressure. If these steps are not enough to manage your blood pressure, you may need to take medicines. Take any prescribed medicines to control hypertension as told by your health care provider. Ask your health care provider if you should monitor your blood pressure at home. Have your blood pressure checked every year, even if your blood pressure is normal. Blood pressure increases with age and some medical conditions. Diabetes. Eating a healthy diet and exercising regularly are important parts of managing your blood sugar (glucose). If your blood sugar cannot be managed through diet and exercise, you may need to take medicines. Take any prescribed medicines to control your diabetes as told by your health care provider. Get evaluated for obstructive sleep apnea. Talk to your health care provider about getting a sleep evaluation if you snore a lot or have excessive sleepiness. Make sure that any other  medical conditions you have, such as atrial fibrillation or atherosclerosis, are managed. Nutrition Follow instructions from your health care provider about what to eat or drink to help manage your health condition. These instructions may include: Reducing your daily calorie intake. Limiting how much salt (sodium) you use to 1,500 milligrams (mg) each day. Using only healthy fats for cooking, such as olive oil, canola oil, or sunflower oil. Eating healthy foods. You can do this by: Choosing foods that are high in fiber, such as whole grains, and fresh fruits and vegetables. Eating at least 5 servings of fruits and vegetables a day. Try to fill one-half of your plate with fruits and vegetables at each meal. Choosing lean protein foods, such as lean cuts of meat, poultry without skin, fish, tofu, beans, and nuts. Eating low-fat dairy products. Avoiding foods that are high in sodium. This can help lower blood pressure. Avoiding foods that have saturated fat, trans fat, and cholesterol. This can help prevent high cholesterol. Avoiding processed and prepared foods. Counting your daily carbohydrate intake.  Lifestyle If you drink alcohol: Limit how much you have to: 0-1 drink a day for women who are not pregnant. 0-2 drinks a day for men. Know how much alcohol is in your drink. In the [place] one drink equals one 12 oz bottle of beer (329m), one 5 oz glass of wine (1478m, or one 1 oz glass of hard liquor (4418m Do not use any products that contain nicotine or tobacco. These products include cigarettes, chewing tobacco, and vaping devices, such as e-cigarettes. If you need help quitting, ask your health care provider. Avoid secondhand smoke. Do not use drugs. Activity  Try to stay at a healthy weight. Get at least 30 minutes of exercise on most days, such as: Fast walking. Biking. Swimming. Medicines Take over-the-counter and prescription medicines only as told by your health care  provider. Aspirin or blood thinners (antiplatelets or anticoagulants) may be recommended to reduce your risk of forming blood clots that can lead to stroke. Avoid taking birth control pills. Talk to your health care provider about the risks of taking birth control pills if: You are over 35 62ars old. You smoke. You get very bad headaches. You have had a blood clot. Where to find more information American Stroke Association: www.strokeassociation.org Get help right away if: You or a loved one has any symptoms of a stroke. "BE FAST" is an easy way to remember the main warning signs of a stroke: B - Balance. Signs are dizziness, sudden trouble walking, or loss of balance. E - Eyes. Signs are trouble seeing or a sudden change in vision. F - Face. Signs are sudden weakness or numbness of the face, or the face or eyelid drooping on one side. A - Arms. Signs are weakness or numbness in an arm. This happens suddenly and usually on one side of the body. S - Speech. Signs are sudden trouble speaking, slurred speech, or trouble understanding what people say. T - Time. Time to call emergency services. Write down what time symptoms started. You or a loved one has other signs of a stroke, such as: A sudden, severe headache with no known cause. Nausea or vomiting. Seizure. These symptoms may represent a serious problem that is an emergency. Do not wait to see if the symptoms will go away. Get medical help right away. Call your local emergency services (911 in the [place] Do not drive yourself to the hospital. Summary You can help to prevent a stroke  by eating healthy, exercising, not smoking, limiting alcohol intake, and managing any medical conditions you may have. Do not use any products that contain nicotine or tobacco. These include cigarettes, chewing tobacco, and vaping devices, such as e-cigarettes. If you need help quitting, ask your health care provider. Remember "BE FAST" for warning signs of a  stroke. Get help right away if you or a loved one has any of these signs. This information is not intended to replace advice given to you by your health care provider. Make sure you discuss any questions you have with your health care provider. Document Revised: 08/01/2022 Document Reviewed: 08/01/2022 Elsevier Patient Education  2024 [employer].

## 2023-03-09 ENCOUNTER — Encounter (HOSPITAL_COMMUNITY): Payer: BC Managed Care – PPO | Admitting: Occupational Therapy

## 2023-03-10 ENCOUNTER — Telehealth (HOSPITAL_COMMUNITY): Payer: Self-pay | Admitting: Physical Therapy

## 2023-03-10 ENCOUNTER — Ambulatory Visit (HOSPITAL_COMMUNITY): Payer: BC Managed Care – PPO | Attending: Internal Medicine | Admitting: Physical Therapy

## 2023-03-10 ENCOUNTER — Encounter (HOSPITAL_COMMUNITY): Payer: Self-pay | Admitting: Occupational Therapy

## 2023-03-10 ENCOUNTER — Other Ambulatory Visit: Payer: Self-pay

## 2023-03-10 ENCOUNTER — Ambulatory Visit (HOSPITAL_COMMUNITY): Payer: BC Managed Care – PPO | Admitting: Occupational Therapy

## 2023-03-10 DIAGNOSIS — M6281 Muscle weakness (generalized): Secondary | ICD-10-CM | POA: Insufficient documentation

## 2023-03-10 DIAGNOSIS — I69354 Hemiplegia and hemiparesis following cerebral infarction affecting left non-dominant side: Secondary | ICD-10-CM | POA: Diagnosis present

## 2023-03-10 DIAGNOSIS — R278 Other lack of coordination: Secondary | ICD-10-CM | POA: Insufficient documentation

## 2023-03-10 DIAGNOSIS — I639 Cerebral infarction, unspecified: Secondary | ICD-10-CM | POA: Diagnosis not present

## 2023-03-10 DIAGNOSIS — R29818 Other symptoms and signs involving the nervous system: Secondary | ICD-10-CM

## 2023-03-10 DIAGNOSIS — R262 Difficulty in walking, not elsewhere classified: Secondary | ICD-10-CM | POA: Diagnosis present

## 2023-03-10 NOTE — Patient Instructions (Signed)
Theraband strengthening: Complete 10-15X, 1-2X/day  1) Shoulder protraction  Anchor band in doorway, stand with back to door. Push your hand forward as much as you can to bringing your shoulder blades forward on your rib cage.      2) Shoulder horizontal abduction  Standing with a theraband anchored at chest height, begin with arm straight and some tension in the band. Move your arm out to your side (keeping straight the whole time). Bring the affected arm back to midline.     3) Shoulder Internal Rotation  While holding an elastic band at your side with your elbow bent, start with your hand away from your stomach, then pull the band towards your stomach. Keep your elbow near your side the entire time.     4) Shoulder External Rotation  While holding an elastic band at your side with your elbow bent, start with your hand near your stomach and then pull the band away. Keep your elbow at your side the entire time.     5) Shoulder flexion  While standing with back to the door, holding Theraband at hand level, raise arm in front of you.  Keep elbow straight through entire movement.      6) Shoulder abduction  While holding an elastic band at your side, draw up your arm to the side keeping your elbow straight.    1) Strengthening: Chest Pull - Resisted   Hold Theraband in front of body with hands about shoulder width a part. Pull band a part and back together slowly. Repeat __10-15__ times. Complete ___1_ set(s) per session.. Repeat ___1_ session(s) per day.  http://orth.exer.us/926   Copyright  VHI. All rights reserved.   2) PNF Strengthening: Resisted   Standing with resistive band around each hand, bring right arm up and away, thumb back. Repeat _10-15___ times per set. Do __1__ sets per session. Do __1__ sessions per day.    3) Resisted External Rotation: in Neutral - Bilateral   Sit or stand, tubing in both hands, elbows at sides, bent to 90, forearms  forward. Pinch shoulder blades together and rotate forearms out. Keep elbows at sides. Repeat _10-15___ times per set. Do __1__ sets per session. Do _1___ sessions per day.  http://orth.exer.us/966   Copyright  VHI. All rights reserved.   4) PNF Strengthening: Resisted   Standing, hold resistive band above head. Bring right arm down and out from side. Repeat _10-15___ times per set. Do __1__ sets per session. Do __1__ sessions per day.  http://orth.exer.us/922   Copyright  VHI. All rights reserved.    

## 2023-03-10 NOTE — Telephone Encounter (Signed)
patient is wanting to see what his insurance in going to pay before he makes any appts

## 2023-03-10 NOTE — Therapy (Signed)
OUTPATIENT OCCUPATIONAL THERAPY NEURO EVALUATION  Patient Name: Jermaine Shaw MRN: [id number] DOB:19-Jun-1971, 52 y.o., male Today's Date: 03/10/2023  PCP: [facility] REFERRING PROVIDER: Erick Blinks, DO  END OF SESSION:  OT End of Session - 03/10/23 2232     Visit Number 1    Number of Visits 1    Date for OT Re-Evaluation 03/13/23    Authorization Type BCBS    OT Start Time 1356    OT Stop Time 1439    OT Time Calculation (min) 43 min    Activity Tolerance Patient tolerated treatment well    Behavior During Therapy WFL for tasks assessed/performed             Past Medical History:  Diagnosis Date   Acute ischemic stroke (HCC)    Heart failure with reduced ejection fraction (HCC)    Hyperlipidemia    Left-sided weakness    Past Surgical History:  Procedure Laterality Date   BACK SURGERY     Patient Active Problem List   Diagnosis Date Noted   Stroke (HCC) 02/01/2023   HFrEF (heart failure with reduced ejection fraction) (HCC) 01/31/2023   CVA (cerebral vascular accident) (HCC) 01/30/2023    ONSET DATE: 01/30/23  REFERRING DIAG: R and L infarcts  THERAPY DIAG:  Other lack of coordination  Hemiplegia and hemiparesis following cerebral infarction affecting left non-dominant side (HCC)  Other symptoms and signs involving the nervous system  Rationale for Evaluation and Treatment: Rehabilitation  SUBJECTIVE:   SUBJECTIVE STATEMENT: "Some days I feel better, others I feel like I did the day of the stroke" Pt accompanied by: self  PERTINENT HISTORY: 52 y.o. male with medical history significant for tobacco abuse who presented to the ED with left arm numbness and heaviness.  Patient was admitted with acute ischemic CVA and neurology has placed recommendations for discharge.  Unfortunately, 2D echocardiogram is now showing newly diagnosed severe LV dysfunction and cardiology is following with recommendations pending.   PRECAUTIONS:  None  WEIGHT BEARING RESTRICTIONS: No  PAIN:  Are you having pain?  No pain today, has intermittent pain throughout L side of body  FALLS: Has patient fallen in last 6 months? No  LIVING ENVIRONMENT: Lives with: lives alone Lives in: House/apartment Stairs: Yes: External: 5 steps; on left going up and bilateral but cannot reach both Has following equipment at home: Single point cane  PLOF: Independent  PATIENT GOALS: "To improve strength"  OBJECTIVE:   HAND DOMINANCE: Right  ADLs: Overall ADLs: Difficulty sustaining grips and lifting 5+ lb objects to complete IADL's, such as cooking and cleaning and yard work.   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  UPPER EXTREMITY MMT:     MMT Left eval  Wrist flexion 4+/5  Wrist extension 5/5  Wrist ulnar deviation 4+/5  Wrist radial deviation 4/5  Wrist pronation 4/5  Wrist supination 5/5  (Blank rows = not tested)  HAND FUNCTION: Grip strength: Right: 140 lbs; Left: 77 lbs, Lateral pinch: Right: 29 lbs, Left: 24 lbs, and 3 point pinch: Right: 21 lbs, Left: 15 lbs  COORDINATION: 9 Hole Peg test: Right: 26.38 sec; Left: 30.14 sec  SENSATION: Pt having constant pain in the left side with  numbness and heaviness.   EDEMA: No swelling noted  COGNITION: Overall cognitive status: Within functional limits for tasks assessed  VISION: Subjective report: "Pt reports mild blurriness" Baseline vision: No visual deficits Visual history:  N/A  OBSERVATIONS: LUE with increased tension and deformity from birth complication   TODAY'S TREATMENT:                                                                                                                               DATE: 03/10/23: Evaluation Only    PATIENT EDUCATION: Education details: Public relations account executive and PNF Strengthening Person educated: Patient Education method: Explanation, Demonstration, and Handouts Education comprehension: verbalized understanding  HOME EXERCISE PROGRAM: Shoulder Strengthening and PNF strengthening   ASSESSMENT:  CLINICAL IMPRESSION: Patient is a 52 y.o. male who was seen today for occupational therapy evaluation for bilateral infarcts. Pt has mild decrease in strength and coordination, however still within functional means. He wants to hold off on OT at this time due to insurance and copay concerns and focus on PT.   PERFORMANCE DEFICITS: in functional skills including IADLs, ROM, strength, muscle spasms, Fine motor control, Gross motor control, body mechanics, and UE functional use.  IMPAIRMENTS: are limiting patient from IADLs, work, and leisure.   CO-MORBIDITIES: has no other co-morbidities that affects occupational performance. Patient will benefit from skilled OT to address above impairments and improve overall function.  MODIFICATION OR ASSISTANCE TO COMPLETE EVALUATION: No modification of tasks or assist necessary to complete an evaluation.  OT OCCUPATIONAL PROFILE AND HISTORY: Problem focused assessment: Including review of records relating to presenting problem.  CLINICAL DECISION MAKING: LOW - limited treatment options, no task modification necessary  REHAB POTENTIAL: Good  EVALUATION COMPLEXITY: Low    PLAN:  OT FREQUENCY: one time visit  OT DURATION: 1 week  PLANNED INTERVENTIONS: therapeutic exercise  RECOMMENDED OTHER SERVICES: PT  CONSULTED AND AGREED WITH PLAN OF CARE: Patient  PLAN FOR NEXT SESSION: Evaluation Only   Trish Mage, OTR/L [facility] [phone] Kennyth Arnold, OT 03/10/2023, 10:35 PM

## 2023-03-10 NOTE — Therapy (Signed)
OUTPATIENT PHYSICAL THERAPY NEURO EVALUATION   Patient Name: Jermaine Shaw MRN: [id number] DOB:Feb 10, 1971, 52 y.o., male Today's Date: 03/10/2023   PCP: [facility]ilio Lovely REFERRING PROVIDER: Maurilio Lovely D, DO  END OF SESSION:  PT End of Session - 03/10/23 1515    Visit Number 1    Number of Visits 12    Date for PT Re-Evaluation 04/21/23    Authorization Type BCBS    Progress Note Due on Visit 10    PT Start Time 1440    PT Stop Time 1515    PT Time Calculation (min) 35 min    Activity Tolerance Patient tolerated treatment well    Behavior During Therapy WFL for tasks assessed/performed             Past Medical History:  Diagnosis Date   Acute ischemic stroke (HCC)    Heart failure with reduced ejection fraction (HCC)    Hyperlipidemia    Left-sided weakness    Past Surgical History:  Procedure Laterality Date   BACK SURGERY     Patient Active Problem List   Diagnosis Date Noted   Stroke (HCC) 02/01/2023   HFrEF (heart failure with reduced ejection fraction) (HCC) 01/31/2023   CVA (cerebral vascular accident) (HCC) 01/30/2023    ONSET DATE: 01/30/23  REFERRING DIAG:  Diagnosis  I63.9 (ICD-10-CM) - Cerebrovascular accident (CVA), unspecified mechanism (HCC)    THERAPY DIAG:  Muscle weakness (generalized)  Difficulty in walking, not elsewhere classified  Rationale for Evaluation and Treatment: Rehabilitation  SUBJECTIVE:                                                                                                                                                                                             SUBJECTIVE STATEMENT: Pt is able to walk without an assistive device but on occasion he feels that he is dragging his Lt foot.  This occurs anytime he walks greater than 5 minutes  Pt accompanied by: self  PERTINENT HISTORY: 52 y.o. male with medical history significant for tobacco abuse who presented to the ED with left arm numbness and heaviness.   Patient was admitted with acute ischemic CVA and neurology has placed recommendations for discharge.  Unfortunately, 2D echocardiogram is now showing newly diagnosed severe LV dysfunction and cardiology is following with recommendations pending.    PAIN:  Are you having pain? No  PRECAUTIONS: None  WEIGHT BEARING RESTRICTIONS: No  FALLS: Has patient fallen in last 6 months? No  LIVING ENVIRONMENT: Lives with: lives with their family Lives in: House/apartment Stairs: Yes: External: 5 steps; on right going up one at a time  PLOF: Independent  PATIENT GOALS: To stop dragging his foot and be stronger on his lt side   OBJECTIVE:     COGNITION: Overall cognitive status: Within functional limits for tasks assessed    LOWER EXTREMITY ROM:   WFL   LOWER EXTREMITY MMT:    MMT Right Eval Left Eval  Hip flexion 5 3+/5  Hip extension 3+ 3-  Hip abduction 5 4  Hip adduction    Hip internal rotation    Hip external rotation    Knee flexion    Knee extension 5/5 3+/5  Ankle dorsiflexion 5/5 4/5  Ankle plantarflexion    Ankle inversion    Ankle eversion    (Blank rows = not tested) FUNCTIONAL TESTS:  30 seconds chair stand test: 12 ; 11 is poor for pt age and sex  2 minute walk test: 490 ft no assistive device decreased Lt UE motion and noted dragging of Lt foot  Single leg stance :  RT: 10"; LT: 30" (therapist stopped at 30"  PATIENT SURVEYS:  FOTO 65  TODAY'S TREATMENT:                                                                                                                              DATE: 03/10/23 Evaluation Ankle dorsiflexion with blue theraband x 5 Supine SLR x 5 Prone hip extension x 5  B Sit to stand x 10    PATIENT EDUCATION: Education details: HEP Person educated: Patient Education method: Explanation/handout Education comprehension: returned demonstration  HOME EXERCISE PROGRAM: Access Code: [email or web address] URL:  [email or web address] Date: 03/10/2023 Prepared by: Virgina Organ  Exercises - Seated Long Arc Quad  - 2 x daily - 7 x weekly - 1 sets - 10 reps - 5" hold - Sit to Stand with Arms Crossed  - 2 x daily - 7 x weekly - 1 sets - 10 reps - Ankle Dorsiflexion with Resistance  - 2 x daily - 7 x weekly - 1 sets - 10 reps - 5" hold - Supine Active Straight Leg Raise  - 2 x daily - 7 x weekly - 1 sets - 10 reps - 5" hold - Prone Hip Extension  - 2 x daily - 7 x weekly - 1 sets - 10 reps - 5" hold  GOALS: Goals reviewed with patient? No  SHORT TERM GOALS: Target date: 04/19/23  PT strength to be increased 1/2 grade to be able to note that he is able to walk for 30 minute prior to any foot dragging  Baseline: Goal status: INITIAL  2.  Pt to be able to SLS on B LE for 20" for decreased risk of falling  Goal status: INITIAL   LONG TERM GOALS: Target date: 05/22/23 (pt is going to wait on to find out how much he has to pay from insurance so will not start right awaY)  PT strength to be increased at least a 4+ /5to be able to  note that he is able to walk for an hour without  his foot dragging  Baseline:  Goal status: INITIAL  2.  PT  to be able to balance for 30" B for reduced risk of falling  Baseline:  Goal status: INITIAL  3.  Pt to be able to go up and down 12 steps in a reciprocal manner without UE hold. Baseline:  Goal status: INITIAL  ASSESSMENT:  CLINICAL IMPRESSION: Patient is a 52 y.o. male who was seen today for physical therapy evaluation and treatment for cva with resulting Lt side weakness.  Evaluation demonstrates decreased strength, decreased balance and decreased activity tolerance.  He will benefit from skilled PT to address these issues and maximize his functioning ability.    OBJECTIVE IMPAIRMENTS: decreased activity tolerance, decreased balance, and decreased strength.   ACTIVITY LIMITATIONS: lifting, standing, squatting, stairs, and locomotion  level  PARTICIPATION LIMITATIONS: shopping, community activity, and occupation   REHAB POTENTIAL: Good  CLINICAL DECISION MAKING: Stable/uncomplicated  EVALUATION COMPLEXITY: Low  PLAN:  PT FREQUENCY: 1x/week  PT DURATION: 6 weeks  PLANNED INTERVENTIONS: Therapeutic exercises, Therapeutic activity, Neuromuscular re-education, Balance training, Gait training, Patient/Family education, Self Care, and Joint mobilization  PLAN FOR NEXT SESSION: High balance and strengthening activities give HEP update each visit.  Virgina Organ, PT CLT [phone]  03/10/2023, 5:09 PM

## 2023-03-21 ENCOUNTER — Ambulatory Visit: Payer: BC Managed Care – PPO | Attending: Nurse Practitioner | Admitting: Nurse Practitioner

## 2023-03-21 ENCOUNTER — Encounter: Payer: Self-pay | Admitting: Nurse Practitioner

## 2023-03-21 VITALS — BP 161/97 | HR 95 | Ht 70.0 in | Wt 154.8 lb

## 2023-03-21 DIAGNOSIS — Z79899 Other long term (current) drug therapy: Secondary | ICD-10-CM | POA: Diagnosis not present

## 2023-03-21 DIAGNOSIS — Z8673 Personal history of transient ischemic attack (TIA), and cerebral infarction without residual deficits: Secondary | ICD-10-CM

## 2023-03-21 DIAGNOSIS — I5020 Unspecified systolic (congestive) heart failure: Secondary | ICD-10-CM

## 2023-03-21 DIAGNOSIS — I10 Essential (primary) hypertension: Secondary | ICD-10-CM | POA: Diagnosis not present

## 2023-03-21 DIAGNOSIS — Z720 Tobacco use: Secondary | ICD-10-CM

## 2023-03-21 MED ORDER — SPIRONOLACTONE 25 MG PO TABS: 25.0000 mg | ORAL_TABLET | Freq: Every day | ORAL | 2 refills | Status: DC

## 2023-03-21 NOTE — Patient Instructions (Addendum)
Medication Instructions:  Your physician has recommended you make the following change in your medication:  Increase Spironolactone to 25 Mg daily, Take Metoprolol Tartrate 12.5 mg twice a day Continue all other medications as prescribed  Labwork: BMET in 1 week at [facility]  Testing/Procedures: none  Follow-Up: Your physician recommends that you schedule a follow-up appointment in: 6 weeks telephone visit with Philis Nettle  Any Other Special Instructions Will Be Listed Below (If Applicable).   If you need a refill on your cardiac medications before your next appointment, please call your pharmacy.

## 2023-03-21 NOTE — Progress Notes (Signed)
Cardiology Office Note:  .   Date:  03/21/2023  ID:  [name], DOB [birth year], MRN [id number] PCP: [facility] Providers Cardiologist:  Dietrich Pates, MD Cardiology APP:  Sharlene Dory, NP    History of Present Illness: .   Jermaine Shaw is a 52 y.o. male with a PMH of ischemic stroke, HLD, HFrEF, HTN, and tobacco abuse, who presents today for HFrEF follow-up.   I last saw patient on February 15, 2023 for hospital follow-up. Continued to note left sided numbness after his stroke in May 2024. Was overall doing well from a cardiac perspective. GDMT titrated for HFrEF.   ED visit for HTN and HA, SBP 170, CT head performed nothing acute.   Today he presents for follow-up. He states he is doing well. No longer on Plavix. Tolerating meds well and complian with his medications. Weight is stable, did have episode of poor appetite but this has improved. Denies any chest pain, shortness of breath, palpitations, syncope, presyncope, dizziness, orthopnea, PND, swelling or significant weight changes, acute bleeding, or claudication. Shows me BP log that reveals elevated readings. Currently wearing 30 day event monitor.   Studies Reviewed: .    Echo 01/2023: 1. Left ventricular ejection fraction, by estimation, is 30 to 35%. The  left ventricle has moderately decreased function. The left ventricle  demonstrates global hypokinesis. Left ventricular diastolic parameters are  consistent with Grade I diastolic  dysfunction (impaired relaxation).   2. Right ventricular systolic function is normal. The right ventricular  size is normal. There is normal pulmonary artery systolic pressure.   3. The mitral valve is normal in structure. Mild mitral valve  regurgitation. No evidence of mitral stenosis.   4. The tricuspid valve is abnormal.   5. The aortic valve is tricuspid. Aortic valve regurgitation is not  visualized. No aortic stenosis is present.   6. The inferior  vena cava is normal in size with greater than 50%  respiratory variability, suggesting right atrial pressure of 3 mmHg.   Carotid duplex 01/2023: IMPRESSION: 1. Bilateral carotid bifurcation plaque resulting in less than 50% diameter ICA stenosis. 2. Antegrade bilateral vertebral arterial flow.  Risk Assessment/Calculations:    HYPERTENSION CONTROL Vitals:   03/21/23 0900 03/21/23 0925  BP: (!) 140/90 (!) 161/97    The patient's blood pressure is elevated above target today.  In order to address the patient's elevated BP: Blood pressure will be monitored at home to determine if medication changes need to be made.; A current anti-hypertensive medication was adjusted today.; Follow up with general cardiology has been recommended.    Physical Exam:   VS:  BP (!) 161/97 (BP Location: Left Arm, Patient Position: Sitting, Cuff Size: Normal)   Pulse 95   Ht 5\' 10"  (1.778 m)   Wt 154 lb 12.8 oz (70.2 kg)   SpO2 97%   BMI 22.21 kg/m    Wt Readings from Last 3 Encounters:  03/21/23 154 lb 12.8 oz (70.2 kg)  03/08/23 151 lb 12.8 oz (68.9 kg)  02/16/23 158 lb 1.1 oz (71.7 kg)    GEN: Well nourished, well developed in no acute distress NECK: No JVD; No carotid bruits CARDIAC: S1/S2, RRR, no murmurs, rubs, gallops RESPIRATORY:  Clear to auscultation without rales, wheezing or rhonchi  ABDOMEN: Soft, non-tender, non-distended EXTREMITIES:  No edema; No deformity   ASSESSMENT AND PLAN: .    HFrEF, medication management Stage C, NYHA class I symptoms. Etiology unclear. Euvolemic and  well compensated on exam. Continue Entresto, Farxiga, Spironolactone, and currently taking Metoprolol tartrate 25 mg daily and not taking Toprol XL. Instructed him to take Lopressor 12.5 mg BID.  Will obtain BMET in 1 week. Low sodium diet, fluid restriction <2L, and daily weights encouraged. Educated to contact our office for weight gain of 2 lbs overnight or 5 lbs in one week. Plan to update Echo once GDMT is  optimal.    2. Hx of ischemic stroke Denies any new or worsening symptoms. Continue ASA and atorvastatin. Not addressed, but att next visit, will discuss obtaining FLP and LFT per protocol. Heart healthy diet and regular cardiovascular exercise encouraged. Currently wearing 30 day monitor as previously recommended to evaluate for A-fib as cause for ischemic stroke.    3. HTN BP elevated Not at goal. Continue Entresto, Lopressor, and increasing Aldactone to 25 mg daily.  Will repeat BMET in 1 week. Discussed to monitor BP at home at least 2 hours after medications and sitting for 5-10 minutes. Given BP log and salty six. Heart healthy diet and regular cardiovascular exercise encouraged.    4. Tobacco abuse Smoking cessation encouraged and discussed.   Dispo: Follow-up with me or APP via telephone visit in 6 weeks.   Signed, Sharlene Dory, NP

## 2023-03-24 ENCOUNTER — Other Ambulatory Visit: Payer: Self-pay | Admitting: Nurse Practitioner

## 2023-03-31 LAB — BASIC METABOLIC PANEL
BUN/Creatinine Ratio: 12 (ref 9–20)
BUN: 14 mg/dL (ref 6–24)
CO2: 24 mmol/L (ref 20–29)
Calcium: 9.8 mg/dL (ref 8.7–10.2)
Chloride: 105 mmol/L (ref 96–106)
Creatinine, Ser: 1.15 mg/dL (ref 0.76–1.27)
Glucose: 151 mg/dL — ABNORMAL HIGH (ref 70–99)
Potassium: 4.2 mmol/L (ref 3.5–5.2)
Sodium: 141 mmol/L (ref 134–144)
eGFR: 76 mL/min/{1.73_m2} (ref >59)

## 2023-05-01 ENCOUNTER — Ambulatory Visit: Payer: BC Managed Care – PPO | Attending: Nurse Practitioner | Admitting: Nurse Practitioner

## 2023-05-01 ENCOUNTER — Encounter: Payer: Self-pay | Admitting: Nurse Practitioner

## 2023-05-01 VITALS — BP 126/90 | Ht 70.0 in | Wt 153.0 lb

## 2023-05-01 DIAGNOSIS — I10 Essential (primary) hypertension: Secondary | ICD-10-CM

## 2023-05-01 DIAGNOSIS — I5020 Unspecified systolic (congestive) heart failure: Secondary | ICD-10-CM | POA: Diagnosis not present

## 2023-05-01 DIAGNOSIS — Z79899 Other long term (current) drug therapy: Secondary | ICD-10-CM

## 2023-05-01 DIAGNOSIS — Z8673 Personal history of transient ischemic attack (TIA), and cerebral infarction without residual deficits: Secondary | ICD-10-CM

## 2023-05-01 DIAGNOSIS — Z720 Tobacco use: Secondary | ICD-10-CM

## 2023-05-01 NOTE — Progress Notes (Unsigned)
Virtual Visit via Telephone Note   Because of Jermaine Shaw's co-morbid illnesses, he is at least at moderate risk for complications without adequate follow up.  This format is felt to be most appropriate for this patient at this time.  The patient did not have access to video technology/had technical difficulties with video requiring transitioning to audio format only (telephone).  All issues noted in this document were discussed and addressed.  No physical exam could be performed with this format.  Please refer to the patient's chart for his consent to telehealth for [facility].    Date:  05/02/2023   ID:  Jermaine Shaw, DOB 03-10-1971, MRN [id number] The patient was identified using 2 identifiers.  Patient Location: Home Provider Location: Office/Clinic PCP:  [facility] Providers Cardiologist:  Dietrich Pates, MD Cardiology APP:  Sharlene Dory, NP     Evaluation Performed:  Follow-Up Visit  Chief Complaint:  Here for telephone visit and for HFrEF follow-up  History of Present Illness:    Jermaine Shaw is a 52 y.o. male with a PMH of ischemic stroke, HLD, HFrEF, HTN, and tobacco abuse, who presents today for HFrEF follow-up.    I saw patient on February 15, 2023 for hospital follow-up. Continued to note left sided numbness after his stroke in May 2024. Was overall doing well from a cardiac perspective. GDMT titrated for HFrEF.    ED visit for HTN and HA, SBP 170, CT head performed nothing acute.    I last saw pt for follow-up on March 21, 2023. Was doing well. Was no longer on Plavix. Tolerating meds well and complian with his medications. Showed me BP log that revealed elevated readings. Was wearing 30 day event monitor.   Today he presents for telephone visit. He states he is doing well. Currently waiting to hear back from physical therapy. Currently taking Metoprolol tartrate 25 mg BID, reads his most recent BP readings that are  overall well controlled. Tolerating his medications well. Denies any chest pain, shortness of breath, palpitations, syncope, presyncope, dizziness, orthopnea, PND, swelling or significant weight changes, acute bleeding, or claudication.   ROS:   Please see the history of present illness.    All other systems reviewed and are negative.  Prior CV studies:   The following studies were reviewed today:  Echo 01/2023: 1. Left ventricular ejection fraction, by estimation, is 30 to 35%. The  left ventricle has moderately decreased function. The left ventricle  demonstrates global hypokinesis. Left ventricular diastolic parameters are  consistent with Grade I diastolic  dysfunction (impaired relaxation).   2. Right ventricular systolic function is normal. The right ventricular  size is normal. There is normal pulmonary artery systolic pressure.   3. The mitral valve is normal in structure. Mild mitral valve  regurgitation. No evidence of mitral stenosis.   4. The tricuspid valve is abnormal.   5. The aortic valve is tricuspid. Aortic valve regurgitation is not  visualized. No aortic stenosis is present.   6. The inferior vena cava is normal in size with greater than 50%  respiratory variability, suggesting right atrial pressure of 3 mmHg.   Carotid duplex 01/2023: IMPRESSION: 1. Bilateral carotid bifurcation plaque resulting in less than 50% diameter ICA stenosis. 2. Antegrade bilateral vertebral arterial flow.  Labs/Other Tests and Data Reviewed:    EKG:  EKG is not ordered today.   Objective:    Vital Signs:  BP (!) 126/90   Ht  5\' 10"  (1.778 m)   Wt 153 lb (69.4 kg)   BMI 21.95 kg/m    Due to nature of today's visit, a physical exam was unable to be performed.  ASSESSMENT & PLAN:    HFrEF Stage C, NYHA class I symptoms. Etiology unclear. Continue Jermaine Shaw, Spironolactone, and Lopressor. Low sodium diet, fluid restriction <2L, and daily weights encouraged. Educated to  contact our office for weight gain of 2 lbs overnight or 5 lbs in one week. Plan to update Echo once GDMT is optimal.    2. Hx of ischemic stroke, medication management Denies any new or worsening symptoms. Continue ASA and atorvastatin. Will obtain FLP and LFT in 2 weeks. No evidence of A-fib on monitor. Heart healthy diet and regular cardiovascular exercise encouraged.    3. HTN BP stable and improved. Continue Entresto, Lopressor, and Aldactone. Discussed to monitor BP at home at least 2 hours after medications and sitting for 5-10 minutes. Given BP log and salty six. Heart healthy diet and regular cardiovascular exercise encouraged.    4. Tobacco abuse Smoking cessation encouraged and discussed.    Dispo: Follow-up with me or APP via telephone visit in 6 weeks.    Time:   Today, I have spent 13 minutes with the patient with telehealth technology discussing the above problems.     Medication Adjustments/Labs and Tests Ordered: Current medicines are reviewed at length with the patient today.  Concerns regarding medicines are outlined above.   Tests Ordered: Orders Placed This Encounter  Procedures   Lipid Profile   Hepatic function panel    Medication Changes: No orders of the defined types were placed in this encounter.   Follow Up:  In Person in 3 month(s)  Signed, Sharlene Dory, NP  05/02/2023 5:51 PM    [facility]

## 2023-05-01 NOTE — Patient Instructions (Addendum)
Medication Instructions:  Your physician recommends that you continue on your current medications as directed. Please refer to the Current Medication list given to you today.  Labwork: FLP and LFT in 2 weeks   Testing/Procedures: none  Follow-Up: Your physician recommends that you schedule a follow-up appointment in: 3 Months with Philis Nettle   Any Other Special Instructions Will Be Listed Below (If Applicable).  If you need a refill on your cardiac medications before your next appointment, please call your pharmacy.

## 2023-05-02 ENCOUNTER — Telehealth: Payer: BC Managed Care – PPO | Admitting: Nurse Practitioner

## 2023-05-03 ENCOUNTER — Telehealth: Payer: Self-pay | Admitting: Internal Medicine

## 2023-05-03 MED ORDER — METOPROLOL TARTRATE 25 MG PO TABS: 25.0000 mg | ORAL_TABLET | Freq: Two times a day (BID) | ORAL | 3 refills | Status: DC

## 2023-05-03 NOTE — Telephone Encounter (Signed)
Completed.

## 2023-05-03 NOTE — Telephone Encounter (Signed)
*  STAT* If patient is at the pharmacy, call can be transferred to refill team.   1. Which medications need to be refilled? (please list name of each medication and dose if known) metoprolol tartrate (LOPRESSOR) 25 MG tablet   2. Which pharmacy/location (including street and city if local pharmacy) is medication to be sent to? [id number] - [place], [place] - [address]   3. Do they need a 30 day or 90 day supply? 90

## 2023-05-05 ENCOUNTER — Ambulatory Visit (HOSPITAL_COMMUNITY): Payer: BC Managed Care – PPO | Attending: Internal Medicine | Admitting: Physical Therapy

## 2023-05-05 DIAGNOSIS — R262 Difficulty in walking, not elsewhere classified: Secondary | ICD-10-CM | POA: Diagnosis present

## 2023-05-05 DIAGNOSIS — M6281 Muscle weakness (generalized): Secondary | ICD-10-CM | POA: Insufficient documentation

## 2023-05-05 NOTE — Therapy (Signed)
OUTPATIENT PHYSICAL THERAPY NEURO Treatment   Patient Name: Jermaine Shaw MRN: [id number] DOB:06/23/1971, 52 y.o., male Today's Date: 05/05/2023   PCP: Maurilio Lovely REFERRING PROVIDER: Maurilio Lovely D, DO  END OF SESSION:  PT End of Session - 05/05/23 0800     Visit Number 2    Number of Visits 8    Date for PT Re-Evaluation 06/04/23    Authorization Type BCBS    Progress Note Due on Visit 10    PT Start Time 0800    PT Stop Time 0845    PT Time Calculation (min) 45 min    Activity Tolerance Patient tolerated treatment well    Behavior During Therapy WFL for tasks assessed/performed                 Past Medical History:  Diagnosis Date   Acute ischemic stroke (HCC)    Heart failure with reduced ejection fraction (HCC)    Hyperlipidemia    Left-sided weakness    Past Surgical History:  Procedure Laterality Date   BACK SURGERY     Patient Active Problem List   Diagnosis Date Noted   Stroke (HCC) 02/01/2023   HFrEF (heart failure with reduced ejection fraction) (HCC) 01/31/2023   CVA (cerebral vascular accident) (HCC) 01/30/2023    ONSET DATE: 01/30/23  REFERRING DIAG:  Diagnosis  I63.9 (ICD-10-CM) - Cerebrovascular accident (CVA), unspecified mechanism (HCC)    THERAPY DIAG:  Muscle weakness (generalized)  Difficulty in walking, not elsewhere classified  Rationale for Evaluation and Treatment: Rehabilitation  SUBJECTIVE:                                                                                                                                                                                             SUBJECTIVE STATEMENT: PT thought we were going to call him for appointments, evaluated on 6/28; (pt was suppose to make appointments on his way out of the clinic.) Pt states that he has a tight feeling in his Lt side which makes it hard for him to breath.  Pt states he is still dragging his foot after he walks for more than 10 minutes.  It is more  notable the faster he goes.   PERTINENT HISTORY: 52 y.o. male with medical history significant for tobacco abuse who presented to the ED with left arm numbness and heaviness.  Patient was admitted with acute ischemic CVA and neurology has placed recommendations for discharge.  Unfortunately, 2D echocardiogram is now showing newly diagnosed severe LV dysfunction and cardiology is following with recommendations pending.    PAIN:  Are you having pain? No  PRECAUTIONS: None  WEIGHT BEARING RESTRICTIONS: No  FALLS: Has patient fallen in last 6 months? No  LIVING ENVIRONMENT: Lives with: lives with their family Lives in: House/apartment Stairs: Yes: External: 5 steps; on right going up one at a time  PLOF: Independent  PATIENT GOALS: To stop dragging his foot and be stronger on his lt side   OBJECTIVE:     COGNITION: Overall cognitive status: Within functional limits for tasks assessed    LOWER EXTREMITY ROM:   [facility]   LOWER EXTREMITY MMT:    MMT Right Eval 8/23 Left Eval 03/10/23 Right 05/05/23  Hip flexion 5  3+/5 4/5  Hip extension 3+ 3+ 3- 3  Hip abduction 5  4 4   Hip adduction      Hip internal rotation      Hip external rotation      Knee flexion      Knee extension 5/5  3+/5 4/5  Ankle dorsiflexion 5/5  4/5 4+  Ankle plantarflexion      Ankle inversion      Ankle eversion      (Blank rows = not tested) FUNCTIONAL TESTS:  30 seconds chair stand test: 12 ; 11 is poor for pt age and sex  05/05/23: 42; 19 is normal for age and sex  2 minute walk test: 490 ft no assistive device decreased Lt UE motion and noted dragging of Lt foot  05/05/23: 2 minute walk test 482 ft no foot dragging noted  Single leg stance :  RT: 10"; LT: 30" (therapist stopped at 30" Rt:05/05/23: 60",  Lt:60"  PATIENT SURVEYS:  FOTO 65  TODAY'S TREATMENT:                                                                                                                              DATE: 05/05/23  reassessment see above.  Stand: Heel raise x 10 Squat x 10 Supine: SLR x 10 Side lying hip abduction Lt w/3# x 10  Prone hip extension x 10  03/10/23 Evaluation Ankle dorsiflexion with blue theraband x 5 Supine SLR x 5 Prone hip extension x 5  B Sit to stand x 10    PATIENT EDUCATION: Education details: HEP Person educated: Patient Education method: Explanation/handout Education comprehension: returned demonstration  HOME EXERCISE PROGRAM: Access Code: [email or web address] URL: [email or web address] Date: 03/10/2023 Prepared by: Virgina Organ  Exercises - Seated Long Arc Quad  - 2 x daily - 7 x weekly - 1 sets - 10 reps - 5" hold - Sit to Stand with Arms Crossed  - 2 x daily - 7 x weekly - 1 sets - 10 reps - Ankle Dorsiflexion with Resistance  - 2 x daily - 7 x weekly - 1 sets - 10 reps - 5" hold - Supine Active Straight Leg Raise  - 2 x daily - 7 x weekly - 1 sets - 10 reps - 5" hold - Prone Hip Extension  - 2  x daily - 7 x weekly - 1 sets - 10 reps - 5" hold 05/05/23 - Sidelying Hip Abduction with Resistance at Ankle  - 1 x daily - 7 x weekly - 1 sets - 10 reps - 5" hold - Squat  - 1 x daily - 7 x weekly - 1 sets - 10 reps - 5' hold - Standing Sidebends  - 2 x daily - 7 x weekly - 1 sets - 5 reps - 10 hold GOALS: Goals reviewed with patient? yes  SHORT TERM GOALS: Target date: 04/19/23  PT strength to be increased 1/2 grade to be able to note that he is able to walk for 30 minute prior to any foot dragging  Baseline: Goal status: ongoing  2.  Pt to be able to SLS on B LE for 20" for decreased risk of falling  Goal status: met   LONG TERM GOALS: Target date: 05/22/23 (pt is going to wait on to find out how much he has to pay from insurance so will not start right awaY)  PT strength to be increased at least a 4+ /5to be able to note that he is able to walk for an hour without  his foot dragging  Baseline:  Goal status: ongoing  2.  PT  to be able to balance for  30" B for reduced risk of falling  Baseline:  Goal status: met  3.  Pt to be able to go up and down 12 steps in a reciprocal manner without UE hold. Baseline:  Goal status: on going  4.  PT to not have a sensation of Lt trunk mm being tight  Goal:  initial   ASSESSMENT:  CLINICAL IMPRESSION: Pt states that he thought he was going to get a call from our clinic to schedule therefore he has not been back since his evaluation.  PT has been completing the exercises given to him during the evaluation.  Re-assessment demonstrates improvement in all areas, however, pt continues to have weak gluteal max and medius mm which affects him after he has been up walking for more than ten minutes.    He will benefit from skilled PT to address these issues and maximize his functioning ability.    OBJECTIVE IMPAIRMENTS: decreased activity tolerance, decreased balance, and decreased strength.   ACTIVITY LIMITATIONS: lifting, standing, squatting, stairs, and locomotion level  PARTICIPATION LIMITATIONS: shopping, community activity, and occupation   REHAB POTENTIAL: Good  CLINICAL DECISION MAKING: Stable/uncomplicated  EVALUATION COMPLEXITY: Low  PLAN:  PT FREQUENCY: 1x/week  PT DURATION: 6 weeks  PLANNED INTERVENTIONS: Therapeutic exercises, Therapeutic activity, Neuromuscular re-education, Balance training, Gait training, Patient/Family education, Self Care, and Joint mobilization  PLAN FOR NEXT SESSION: High balance and strengthening activities give HEP update each visit.  Virgina Organ, PT CLT [phone]  05/05/2023, 8:47 AM

## 2023-05-09 ENCOUNTER — Encounter (HOSPITAL_COMMUNITY): Payer: Self-pay

## 2023-05-09 ENCOUNTER — Ambulatory Visit (HOSPITAL_COMMUNITY): Payer: BC Managed Care – PPO

## 2023-05-09 DIAGNOSIS — R262 Difficulty in walking, not elsewhere classified: Secondary | ICD-10-CM

## 2023-05-09 DIAGNOSIS — M6281 Muscle weakness (generalized): Secondary | ICD-10-CM

## 2023-05-09 NOTE — Therapy (Signed)
OUTPATIENT PHYSICAL THERAPY NEURO Treatment   Patient Name: Jermaine Shaw MRN: [id number] DOB:24-Mar-1971, 52 y.o., male Today's Date: 05/09/2023   PCP: Maurilio Lovely REFERRING PROVIDER: Maurilio Lovely D, DO  END OF SESSION:  PT End of Session - 05/09/23 0956     Visit Number 3    Number of Visits 8    Date for PT Re-Evaluation 06/04/23    Authorization Type BCBS    Progress Note Due on Visit 10    PT Start Time 0957    PT Stop Time 1040    PT Time Calculation (min) 43 min                 Past Medical History:  Diagnosis Date   Acute ischemic stroke (HCC)    Heart failure with reduced ejection fraction (HCC)    Hyperlipidemia    Left-sided weakness    Past Surgical History:  Procedure Laterality Date   BACK SURGERY     Patient Active Problem List   Diagnosis Date Noted   Stroke (HCC) 02/01/2023   HFrEF (heart failure with reduced ejection fraction) (HCC) 01/31/2023   CVA (cerebral vascular accident) (HCC) 01/30/2023    ONSET DATE: 01/30/23  REFERRING DIAG:  Diagnosis  I63.9 (ICD-10-CM) - Cerebrovascular accident (CVA), unspecified mechanism (HCC)    THERAPY DIAG:  Muscle weakness (generalized)  Difficulty in walking, not elsewhere classified  Rationale for Evaluation and Treatment: Rehabilitation  SUBJECTIVE:                                                                                                                                                                                             SUBJECTIVE STATEMENT:  Pt stated he has cramping with prone hip extension during HEP.  No reports of pain.   PERTINENT HISTORY: 52 y.o. male with medical history significant for tobacco abuse who presented to the ED with left arm numbness and heaviness.  Patient was admitted with acute ischemic CVA and neurology has placed recommendations for discharge.  Unfortunately, 2D echocardiogram is now showing newly diagnosed severe LV dysfunction and cardiology is  following with recommendations pending.    PAIN:  Are you having pain? No  PRECAUTIONS: None  WEIGHT BEARING RESTRICTIONS: No  FALLS: Has patient fallen in last 6 months? No  LIVING ENVIRONMENT: Lives with: lives with their family Lives in: House/apartment Stairs: Yes: External: 5 steps; on right going up one at a time  PLOF: Independent  PATIENT GOALS: To stop dragging his foot and be stronger on his lt side   OBJECTIVE:     COGNITION: Overall cognitive status: Within functional limits  for tasks assessed    LOWER EXTREMITY ROM:   [facility]   LOWER EXTREMITY MMT:    MMT Right Eval 8/23 Left Eval 03/10/23 Right 05/05/23  Hip flexion 5  3+/5 4/5  Hip extension 3+ 3+ 3- 3  Hip abduction 5  4 4   Hip adduction      Hip internal rotation      Hip external rotation      Knee flexion      Knee extension 5/5  3+/5 4/5  Ankle dorsiflexion 5/5  4/5 4+  Ankle plantarflexion      Ankle inversion      Ankle eversion      (Blank rows = not tested) FUNCTIONAL TESTS:  30 seconds chair stand test: 12 ; 11 is poor for pt age and sex  05/05/23: 37; 19 is normal for age and sex  2 minute walk test: 490 ft no assistive device decreased Lt UE motion and noted dragging of Lt foot  05/05/23: 2 minute walk test 482 ft no foot dragging noted  Single leg stance :  RT: 10"; LT: 30" (therapist stopped at 30" Rt:05/05/23: 60",  Lt:60"  PATIENT SURVEYS:  FOTO 65  TODAY'S TREATMENT:                                                                                                                              DATE: 05/09/23: Standing:  Heel raise 15x incline slope Toe raise 15x decline slope with HHA Squats with 2# BUE, DC weight as increased tightness on Lt side Sidestep GTB around thigh 2RT Vector stance 3x 5" intermittent HHA Tandem stance 2x 30" on floor, 2x 30" on foam; 3rd set head turns on foam   05/05/23 reassessment see above.  Stand: Heel raise x 10 Squat x 10 Supine: SLR x  10 Side lying hip abduction Lt w/3# x 10  Prone hip extension x 10  03/10/23 Evaluation Ankle dorsiflexion with blue theraband x 5 Supine SLR x 5 Prone hip extension x 5  B Sit to stand x 10    PATIENT EDUCATION: Education details: HEP Person educated: Patient Education method: Explanation/handout Education comprehension: returned demonstration  HOME EXERCISE PROGRAM: Access Code: [email or web address] URL: [email or web address] Date: 03/10/2023 Prepared by: Virgina Organ  Exercises - Seated Long Arc Quad  - 2 x daily - 7 x weekly - 1 sets - 10 reps - 5" hold - Sit to Stand with Arms Crossed  - 2 x daily - 7 x weekly - 1 sets - 10 reps - Ankle Dorsiflexion with Resistance  - 2 x daily - 7 x weekly - 1 sets - 10 reps - 5" hold - Supine Active Straight Leg Raise  - 2 x daily - 7 x weekly - 1 sets - 10 reps - 5" hold - Prone Hip Extension  - 2 x daily - 7 x weekly - 1 sets - 10 reps - 5" hold 05/05/23 - Sidelying  Hip Abduction with Resistance at Ankle  - 1 x daily - 7 x weekly - 1 sets - 10 reps - 5" hold - Squat  - 1 x daily - 7 x weekly - 1 sets - 10 reps - 5' hold - Standing Sidebends  - 2 x daily - 7 x weekly - 1 sets - 5 reps - 10 hold  05/09/23: - Side Stepping with Resistance at Thighs and Counter Support  - 1 x daily - 7 x weekly - 3 sets - 10 reps - Standing Tandem Balance with Counter Support  - 2 x daily - 7 x weekly - 3 sets - 3 reps - 30" hold - Standing 3-Way Kick  - 1 x daily - 7 x weekly - 1 sets - 5 reps - 5" hold  GOALS: Goals reviewed with patient? yes  SHORT TERM GOALS: Target date: 04/19/23  PT strength to be increased 1/2 grade to be able to note that he is able to walk for 30 minute prior to any foot dragging  Baseline: Goal status: ongoing  2.  Pt to be able to SLS on B LE for 20" for decreased risk of falling  Goal status: met   LONG TERM GOALS: Target date: 05/22/23 (pt is going to wait on to find out how much he has to pay from insurance so will  not start right awaY)  PT strength to be increased at least a 4+ /5to be able to note that he is able to walk for an hour without  his foot dragging  Baseline:  Goal status: ongoing  2.  PT  to be able to balance for 30" B for reduced risk of falling  Baseline:  Goal status: met  3.  Pt to be able to go up and down 12 steps in a reciprocal manner without UE hold. Baseline:  Goal status: on going  4.  PT to not have a sensation of Lt trunk mm being tight  Goal:  initial   ASSESSMENT:  CLINICAL IMPRESSION: Session focus with LE strengthening and balance training.  Added sidestep and vector stance for hip stability and tandem stance for balance training.  Pt did well with tandem stance, added dynamic surface with increased difficulty required intermittent HHA.  Added new exercises to HEP, encouraged to complete in safe location with HHA.  Trail with 2# BUE during squats, reports of increased tingling and tightness Lt trunk so D/C barbells.  Educated on increased hold time beneficial for stretches.  OBJECTIVE IMPAIRMENTS: decreased activity tolerance, decreased balance, and decreased strength.   ACTIVITY LIMITATIONS: lifting, standing, squatting, stairs, and locomotion level  PARTICIPATION LIMITATIONS: shopping, community activity, and occupation   REHAB POTENTIAL: Good  CLINICAL DECISION MAKING: Stable/uncomplicated  EVALUATION COMPLEXITY: Low  PLAN:  PT FREQUENCY: 1x/week  PT DURATION: 6 weeks  PLANNED INTERVENTIONS: Therapeutic exercises, Therapeutic activity, Neuromuscular re-education, Balance training, Gait training, Patient/Family education, Self Care, and Joint mobilization  PLAN FOR NEXT SESSION: High balance and strengthening activities give HEP update each visit.  Becky Sax, LPTA/CLT; CBIS [id number]  Juel Burrow, PTA 05/09/2023, 11:35 AM  05/09/2023, 11:35 AM

## 2023-05-11 ENCOUNTER — Ambulatory Visit (HOSPITAL_COMMUNITY): Payer: BC Managed Care – PPO

## 2023-05-11 ENCOUNTER — Encounter (HOSPITAL_COMMUNITY): Payer: Self-pay

## 2023-05-11 DIAGNOSIS — M6281 Muscle weakness (generalized): Secondary | ICD-10-CM | POA: Diagnosis not present

## 2023-05-11 DIAGNOSIS — R262 Difficulty in walking, not elsewhere classified: Secondary | ICD-10-CM

## 2023-05-11 NOTE — Therapy (Signed)
OUTPATIENT PHYSICAL THERAPY NEURO Treatment   Patient Name: Jermaine Shaw MRN: [id number] DOB:07-Jul-1971, 52 y.o., male Today's Date: 05/11/2023   PCP: Maurilio Lovely REFERRING PROVIDER: Maurilio Lovely D, DO  END OF SESSION:  PT End of Session - 05/11/23 0746     Visit Number 4    Number of Visits 8    Date for PT Re-Evaluation 06/04/23    Authorization Type BCBS    Progress Note Due on Visit 10    PT Start Time [phone]   late sign in   PT Stop Time 0914    PT Time Calculation (min) 35 min    Activity Tolerance Patient tolerated treatment well    Behavior During Therapy [facility] for tasks assessed/performed                  Past Medical History:  Diagnosis Date   Acute ischemic stroke (HCC)    Heart failure with reduced ejection fraction (HCC)    Hyperlipidemia    Left-sided weakness    Past Surgical History:  Procedure Laterality Date   BACK SURGERY     Patient Active Problem List   Diagnosis Date Noted   Stroke (HCC) 02/01/2023   HFrEF (heart failure with reduced ejection fraction) (HCC) 01/31/2023   CVA (cerebral vascular accident) (HCC) 01/30/2023    ONSET DATE: 01/30/23  REFERRING DIAG:  Diagnosis  I63.9 (ICD-10-CM) - Cerebrovascular accident (CVA), unspecified mechanism (HCC)    THERAPY DIAG:  Muscle weakness (generalized)  Difficulty in walking, not elsewhere classified  Rationale for Evaluation and Treatment: Rehabilitation  SUBJECTIVE:                                                                                                                                                                                             SUBJECTIVE STATEMENT:   Pt stated he added new exercises at home, reports he is sore today.  No reports of pain or cramping.     PERTINENT HISTORY: 52 y.o. male with medical history significant for tobacco abuse who presented to the ED with left arm numbness and heaviness.  Patient was admitted with acute ischemic CVA and neurology  has placed recommendations for discharge.  Unfortunately, 2D echocardiogram is now showing newly diagnosed severe LV dysfunction and cardiology is following with recommendations pending.    PAIN:  Are you having pain? No  PRECAUTIONS: None  WEIGHT BEARING RESTRICTIONS: No  FALLS: Has patient fallen in last 6 months? No  LIVING ENVIRONMENT: Lives with: lives with their family Lives in: House/apartment Stairs: Yes: External: 5 steps; on right going up one at a time  PLOF: Independent  PATIENT GOALS: To stop dragging his foot and be stronger on his lt side   OBJECTIVE:     COGNITION: Overall cognitive status: Within functional limits for tasks assessed    LOWER EXTREMITY ROM:   [facility]   LOWER EXTREMITY MMT:    MMT Right Eval 8/23 Left Eval 03/10/23 Right 05/05/23  Hip flexion 5  3+/5 4/5  Hip extension 3+ 3+ 3- 3  Hip abduction 5  4 4   Hip adduction      Hip internal rotation      Hip external rotation      Knee flexion      Knee extension 5/5  3+/5 4/5  Ankle dorsiflexion 5/5  4/5 4+  Ankle plantarflexion      Ankle inversion      Ankle eversion      (Blank rows = not tested) FUNCTIONAL TESTS:  30 seconds chair stand test: 12 ; 11 is poor for pt age and sex  05/05/23: 24; 19 is normal for age and sex  2 minute walk test: 490 ft no assistive device decreased Lt UE motion and noted dragging of Lt foot  05/05/23: 2 minute walk test 482 ft no foot dragging noted  Single leg stance :  RT: 10"; LT: 30" (therapist stopped at 30" Rt:05/05/23: 60",  Lt:60"  PATIENT SURVEYS:  FOTO 65  TODAY'S TREATMENT:                                                                                                                              DATE: 05/11/23: Standing: Squat to heel raise 15x Tandem stance on foam 2x 30", 2x with horizontal head turns Vector stance 3x 5" Tandem gait 2RT Posterior lunge 5x  Body craft leg press 4Pl 2x 10 Bodycraft side step and retro gait 3PL  5RT    05/09/23: Standing:  Heel raise 15x incline slope Toe raise 15x decline slope with HHA Squats with 2# BUE, DC weight as increased tightness on Lt side Sidestep GTB around thigh 2RT Vector stance 3x 5" intermittent HHA Tandem stance 2x 30" on floor, 2x 30" on foam; 3rd set head turns on foam   05/05/23 reassessment see above.  Stand: Heel raise x 10 Squat x 10 Supine: SLR x 10 Side lying hip abduction Lt w/3# x 10  Prone hip extension x 10  03/10/23 Evaluation Ankle dorsiflexion with blue theraband x 5 Supine SLR x 5 Prone hip extension x 5  B Sit to stand x 10    PATIENT EDUCATION: Education details: HEP Person educated: Patient Education method: Explanation/handout Education comprehension: returned demonstration  HOME EXERCISE PROGRAM: Access Code: [email or web address] URL: [email or web address] Date: 03/10/2023 Prepared by: Virgina Organ  Exercises - Seated Long Arc Quad  - 2 x daily - 7 x weekly - 1 sets - 10 reps - 5" hold - Sit to Stand with Arms Crossed  - 2 x daily - 7 x weekly -  1 sets - 10 reps - Ankle Dorsiflexion with Resistance  - 2 x daily - 7 x weekly - 1 sets - 10 reps - 5" hold - Supine Active Straight Leg Raise  - 2 x daily - 7 x weekly - 1 sets - 10 reps - 5" hold - Prone Hip Extension  - 2 x daily - 7 x weekly - 1 sets - 10 reps - 5" hold 05/05/23 - Sidelying Hip Abduction with Resistance at Ankle  - 1 x daily - 7 x weekly - 1 sets - 10 reps - 5" hold - Squat  - 1 x daily - 7 x weekly - 1 sets - 10 reps - 5' hold - Standing Sidebends  - 2 x daily - 7 x weekly - 1 sets - 5 reps - 10 hold  05/09/23: - Side Stepping with Resistance at Thighs and Counter Support  - 1 x daily - 7 x weekly - 3 sets - 10 reps - Standing Tandem Balance with Counter Support  - 2 x daily - 7 x weekly - 3 sets - 3 reps - 30" hold - Standing 3-Way Kick  - 1 x daily - 7 x weekly - 1 sets - 5 reps - 5" hold  GOALS: Goals reviewed with patient? yes  SHORT TERM  GOALS: Target date: 04/19/23  PT strength to be increased 1/2 grade to be able to note that he is able to walk for 30 minute prior to any foot dragging  Baseline: Goal status: ongoing  2.  Pt to be able to SLS on B LE for 20" for decreased risk of falling  Goal status: met   LONG TERM GOALS: Target date: 05/22/23 (pt is going to wait on to find out how much he has to pay from insurance so will not start right awaY)  PT strength to be increased at least a 4+ /5to be able to note that he is able to walk for an hour without  his foot dragging  Baseline:  Goal status: ongoing  2.  PT  to be able to balance for 30" B for reduced risk of falling  Baseline:  Goal status: met  3.  Pt to be able to go up and down 12 steps in a reciprocal manner without UE hold. Baseline:  Goal status: on going  4.  PT to not have a sensation of Lt trunk mm being tight  Goal:  initial   ASSESSMENT:  CLINICAL IMPRESSION: Pt progressing well with overall stability.  Began session with combo movements for strength and balance.  Pt able to complete tandem stance head turns with no HHA required.  Added bodycraft walking and leg press for hip stability with therapist SBA for safety.  Noted pt coming 2x/week with evaluation frequency at 1x/week, encouraged to cancel 1 apt per week.    OBJECTIVE IMPAIRMENTS: decreased activity tolerance, decreased balance, and decreased strength.   ACTIVITY LIMITATIONS: lifting, standing, squatting, stairs, and locomotion level  PARTICIPATION LIMITATIONS: shopping, community activity, and occupation   REHAB POTENTIAL: Good  CLINICAL DECISION MAKING: Stable/uncomplicated  EVALUATION COMPLEXITY: Low  PLAN:  PT FREQUENCY: 1x/week  PT DURATION: 6 weeks  PLANNED INTERVENTIONS: Therapeutic exercises, Therapeutic activity, Neuromuscular re-education, Balance training, Gait training, Patient/Family education, Self Care, and Joint mobilization  PLAN FOR NEXT SESSION: High  balance and strengthening activities give HEP update each visit.  Begin balance beam and lunges next session.  Becky Sax, LPTA/CLT; CBIS [id number]  Juel Burrow,  PTA 05/11/2023, 8:30 AM  05/11/2023, 8:30 AM

## 2023-05-16 ENCOUNTER — Ambulatory Visit (HOSPITAL_COMMUNITY): Payer: BC Managed Care – PPO

## 2023-05-18 ENCOUNTER — Ambulatory Visit (HOSPITAL_COMMUNITY): Payer: Self-pay | Attending: Internal Medicine | Admitting: Physical Therapy

## 2023-05-18 ENCOUNTER — Encounter (HOSPITAL_COMMUNITY): Payer: Self-pay | Admitting: Physical Therapy

## 2023-05-18 DIAGNOSIS — R278 Other lack of coordination: Secondary | ICD-10-CM | POA: Insufficient documentation

## 2023-05-18 DIAGNOSIS — I69354 Hemiplegia and hemiparesis following cerebral infarction affecting left non-dominant side: Secondary | ICD-10-CM | POA: Insufficient documentation

## 2023-05-18 DIAGNOSIS — M6281 Muscle weakness (generalized): Secondary | ICD-10-CM | POA: Insufficient documentation

## 2023-05-18 DIAGNOSIS — R29818 Other symptoms and signs involving the nervous system: Secondary | ICD-10-CM | POA: Diagnosis present

## 2023-05-18 DIAGNOSIS — R262 Difficulty in walking, not elsewhere classified: Secondary | ICD-10-CM | POA: Insufficient documentation

## 2023-05-18 NOTE — Therapy (Signed)
OUTPATIENT PHYSICAL THERAPY NEURO Treatment   Patient Name: Jermaine Shaw MRN: [id number] DOB:Nov 29, 1970, 52 y.o., male Today's Date: 05/18/2023   PCP: Maurilio Lovely REFERRING PROVIDER: Maurilio Lovely D, DO  END OF SESSION:  PT End of Session - 05/18/23 0947     Visit Number 5    Number of Visits 8    Date for PT Re-Evaluation 06/04/23    Authorization Type BCBS    Progress Note Due on Visit 10    PT Start Time [phone]    PT Stop Time 1030    PT Time Calculation (min) 43 min    Activity Tolerance Patient tolerated treatment well    Behavior During Therapy WFL for tasks assessed/performed                  Past Medical History:  Diagnosis Date   Acute ischemic stroke (HCC)    Heart failure with reduced ejection fraction (HCC)    Hyperlipidemia    Left-sided weakness    Past Surgical History:  Procedure Laterality Date   BACK SURGERY     Patient Active Problem List   Diagnosis Date Noted   Stroke (HCC) 02/01/2023   HFrEF (heart failure with reduced ejection fraction) (HCC) 01/31/2023   CVA (cerebral vascular accident) (HCC) 01/30/2023    ONSET DATE: 01/30/23  REFERRING DIAG:  Diagnosis  I63.9 (ICD-10-CM) - Cerebrovascular accident (CVA), unspecified mechanism (HCC)    THERAPY DIAG:  Muscle weakness (generalized)  Difficulty in walking, not elsewhere classified  Other lack of coordination  Hemiplegia and hemiparesis following cerebral infarction affecting left non-dominant side (HCC)  Other symptoms and signs involving the nervous system  Rationale for Evaluation and Treatment: Rehabilitation  SUBJECTIVE:                                                                                                                                                                                             SUBJECTIVE STATEMENT:  Pt states he is feeling more tight this morning -- does not feel too good. Has been consistent with HEP. Can feel some increased cramping in  his hand and in the back of his leg.    PERTINENT HISTORY: 52 y.o. male with medical history significant for tobacco abuse who presented to the ED with left arm numbness and heaviness.  Patient was admitted with acute ischemic CVA and neurology has placed recommendations for discharge.  Unfortunately, 2D echocardiogram is now showing newly diagnosed severe LV dysfunction and cardiology is following with recommendations pending.    PAIN:  Are you having pain? No  PRECAUTIONS: None  WEIGHT BEARING RESTRICTIONS: No  FALLS: Has patient fallen in last 6 months? No  LIVING ENVIRONMENT: Lives with: lives with their family Lives in: House/apartment Stairs: Yes: External: 5 steps; on right going up one at a time  PLOF: Independent  PATIENT GOALS: To stop dragging his foot and be stronger on his lt side   OBJECTIVE:    COGNITION: Overall cognitive status: Within functional limits for tasks assessed    LOWER EXTREMITY ROM:   [facility]   LOWER EXTREMITY MMT:    MMT Right Eval 8/23 Left Eval 03/10/23 Right 05/05/23  Hip flexion 5  3+/5 4/5  Hip extension 3+ 3+ 3- 3  Hip abduction 5  4 4   Hip adduction      Hip internal rotation      Hip external rotation      Knee flexion      Knee extension 5/5  3+/5 4/5  Ankle dorsiflexion 5/5  4/5 4+  Ankle plantarflexion      Ankle inversion      Ankle eversion      (Blank rows = not tested) FUNCTIONAL TESTS:  30 seconds chair stand test: 12 ; 11 is poor for pt age and sex  05/05/23: 27; 19 is normal for age and sex  2 minute walk test: 490 ft no assistive device decreased Lt UE motion and noted dragging of Lt foot  05/05/23: 2 minute walk test 482 ft no foot dragging noted  Single leg stance :  RT: 10"; LT: 30" (therapist stopped at 30" Rt:05/05/23: 60",  Lt:60"  PATIENT SURVEYS:  FOTO 65  TODAY'S TREATMENT:                                                                                                                               DATE: 05/18/23: Recumbent bike L1 x 5 min for warm up Seated  Hamstring stretch x30 sec  Figure 4 stretch x30 sec  Wrist extensor stretch x30 sec Standing  Lat stretch against wall x30 sec  QL stretch with doorframe x30 sec  Gastroc stretch on slant board x30 sec  SL heel raise on slant board 2x10  SL toe raise 2x10  Counter squat 3x10  SLS x20"  Runner's step ups 6" step 2x10   05/11/23: Standing: Squat to heel raise 15x Tandem stance on foam 2x 30", 2x with horizontal head turns Vector stance 3x 5" Tandem gait 2RT Posterior lunge 5x Body craft leg press 4Pl 2x 10 Bodycraft side step and retro gait 3PL 5RT    05/09/23: Standing:  Heel raise 15x incline slope Toe raise 15x decline slope with HHA Squats with 2# BUE, DC weight as increased tightness on Lt side Sidestep GTB around thigh 2RT Vector stance 3x 5" intermittent HHA Tandem stance 2x 30" on floor, 2x 30" on foam; 3rd set head turns on foam   05/05/23 reassessment see above.  Stand: Heel raise x 10 Squat x 10 Supine: SLR x 10 Side  lying hip abduction Lt w/3# x 10  Prone hip extension x 10   03/10/23 Evaluation Ankle dorsiflexion with blue theraband x 5 Supine SLR x 5 Prone hip extension x 5  B Sit to stand x 10    PATIENT EDUCATION: Education details: HEP Person educated: Patient Education method: Explanation/handout Education comprehension: returned demonstration  HOME EXERCISE PROGRAM: Access Code: [email or web address] URL: [email or web address] Date: 03/10/2023 Prepared by: Virgina Organ  Exercises - Seated Long Arc Quad  - 2 x daily - 7 x weekly - 1 sets - 10 reps - 5" hold - Sit to Stand with Arms Crossed  - 2 x daily - 7 x weekly - 1 sets - 10 reps - Ankle Dorsiflexion with Resistance  - 2 x daily - 7 x weekly - 1 sets - 10 reps - 5" hold - Supine Active Straight Leg Raise  - 2 x daily - 7 x weekly - 1 sets - 10 reps - 5" hold - Prone Hip Extension  - 2 x daily - 7 x weekly - 1 sets  - 10 reps - 5" hold 05/05/23 - Sidelying Hip Abduction with Resistance at Ankle  - 1 x daily - 7 x weekly - 1 sets - 10 reps - 5" hold - Squat  - 1 x daily - 7 x weekly - 1 sets - 10 reps - 5' hold - Standing Sidebends  - 2 x daily - 7 x weekly - 1 sets - 5 reps - 10 hold  05/09/23: - Side Stepping with Resistance at Thighs and Counter Support  - 1 x daily - 7 x weekly - 3 sets - 10 reps - Standing Tandem Balance with Counter Support  - 2 x daily - 7 x weekly - 3 sets - 3 reps - 30" hold - Standing 3-Way Kick  - 1 x daily - 7 x weekly - 1 sets - 5 reps - 5" hold  GOALS: Goals reviewed with patient? yes  SHORT TERM GOALS: Target date: 04/19/23  PT strength to be increased 1/2 grade to be able to note that he is able to walk for 30 minute prior to any foot dragging  Baseline: 05/18/23: States it is inconsistent -- he can start to feel it drag and then go on without noticing dragging Goal status: ongoing  2.  Pt to be able to SLS on B LE for 20" for decreased risk of falling  Goal status: met   LONG TERM GOALS: Target date: 05/22/23 (pt is going to wait on to find out how much he has to pay from insurance so will not start right awaY)  PT strength to be increased at least a 4+ /5to be able to note that he is able to walk for an hour without  his foot dragging  Baseline:  Goal status: ongoing  2.  PT  to be able to balance for 30" B for reduced risk of falling  Baseline:  Goal status: met  3.  Pt to be able to go up and down 12 steps in a reciprocal manner without UE hold. Baseline:  Goal status: on going   4.  PT to not have a sensation of Lt trunk mm being tight  Goal:  initial   ASSESSMENT:  CLINICAL IMPRESSION: Pt reports increased stiffness/tightness today. Provided pt with stretches to address L side tightness. Continued to progress strengthening and balance as tolerated.  OBJECTIVE IMPAIRMENTS: decreased activity tolerance, decreased balance, and decreased strength.  ACTIVITY LIMITATIONS: lifting, standing, squatting, stairs, and locomotion level  PARTICIPATION LIMITATIONS: shopping, community activity, and occupation   REHAB POTENTIAL: Good  CLINICAL DECISION MAKING: Stable/uncomplicated  EVALUATION COMPLEXITY: Low  PLAN:  PT FREQUENCY: 1x/week  PT DURATION: 6 weeks  PLANNED INTERVENTIONS: Therapeutic exercises, Therapeutic activity, Neuromuscular re-education, Balance training, Gait training, Patient/Family education, Self Care, and Joint mobilization  PLAN FOR NEXT SESSION: High balance and strengthening activities give HEP update each visit.  Begin balance beam and lunges next session.   Bekki Tavenner April Ma L Elick Aguilera, PT 05/18/2023, 9:47 AM

## 2023-05-23 ENCOUNTER — Ambulatory Visit (HOSPITAL_COMMUNITY): Payer: BC Managed Care – PPO

## 2023-05-23 LAB — HEPATIC FUNCTION PANEL
ALT: 19 IU/L (ref 0–44)
AST: 18 IU/L (ref 0–40)
Albumin: 4.2 g/dL (ref 3.8–4.9)
Alkaline Phosphatase: 94 IU/L (ref 44–121)
Bilirubin Total: 0.4 mg/dL (ref 0.0–1.2)
Bilirubin, Direct: 0.12 mg/dL (ref 0.00–0.40)
Total Protein: 6.5 g/dL (ref 6.0–8.5)

## 2023-05-23 LAB — LIPID PANEL
Chol/HDL Ratio: 3.6 ratio (ref 0.0–5.0)
Cholesterol, Total: 125 mg/dL (ref 100–199)
HDL: 35 mg/dL — ABNORMAL LOW (ref >39)
LDL Chol Calc (NIH): 58 mg/dL (ref 0–99)
Triglycerides: 190 mg/dL — ABNORMAL HIGH (ref 0–149)
VLDL Cholesterol Cal: 32 mg/dL (ref 5–40)

## 2023-05-26 ENCOUNTER — Ambulatory Visit (HOSPITAL_COMMUNITY): Payer: BC Managed Care – PPO | Admitting: Physical Therapy

## 2023-05-30 ENCOUNTER — Encounter (HOSPITAL_COMMUNITY): Payer: Self-pay

## 2023-05-30 ENCOUNTER — Ambulatory Visit (HOSPITAL_COMMUNITY): Payer: Self-pay

## 2023-05-30 DIAGNOSIS — M6281 Muscle weakness (generalized): Secondary | ICD-10-CM

## 2023-05-30 DIAGNOSIS — R262 Difficulty in walking, not elsewhere classified: Secondary | ICD-10-CM

## 2023-05-30 NOTE — Therapy (Signed)
OUTPATIENT PHYSICAL THERAPY NEURO Treatment   Patient Name: Jermaine Shaw MRN: [id number] DOB:02/21/1971, 52 y.o., male Today's Date: 05/30/2023   PCP: Maurilio Lovely REFERRING PROVIDER: Maurilio Lovely D, DO  END OF SESSION:  PT End of Session - 05/30/23 0733     Visit Number 6    Number of Visits 8    Date for PT Re-Evaluation 06/04/23    Authorization Type BCBS    Progress Note Due on Visit 10    PT Start Time [phone]   late sign in   PT Stop Time 0800    PT Time Calculation (min) 27 min    Activity Tolerance Patient tolerated treatment well    Behavior During Therapy [facility] for tasks assessed/performed                  Past Medical History:  Diagnosis Date   Acute ischemic stroke (HCC)    Heart failure with reduced ejection fraction (HCC)    Hyperlipidemia    Left-sided weakness    Past Surgical History:  Procedure Laterality Date   BACK SURGERY     Patient Active Problem List   Diagnosis Date Noted   Stroke (HCC) 02/01/2023   HFrEF (heart failure with reduced ejection fraction) (HCC) 01/31/2023   CVA (cerebral vascular accident) (HCC) 01/30/2023    ONSET DATE: 01/30/23  REFERRING DIAG:  Diagnosis  I63.9 (ICD-10-CM) - Cerebrovascular accident (CVA), unspecified mechanism (HCC)    THERAPY DIAG:  Muscle weakness (generalized)  Difficulty in walking, not elsewhere classified  Rationale for Evaluation and Treatment: Rehabilitation  SUBJECTIVE:                                                                                                                                                                                             SUBJECTIVE STATEMENT:  Pt late for apt today.  Reports he lost power last night.  Stated his Lt heel has been bothering him, unable to put pressure when walking.  Pain scale 5/10 today.  Reports he continues to feel tightness on Lt side.     PERTINENT HISTORY: 52 y.o. male with medical history significant for tobacco abuse who  presented to the ED with left arm numbness and heaviness.  Patient was admitted with acute ischemic CVA and neurology has placed recommendations for discharge.  Unfortunately, 2D echocardiogram is now showing newly diagnosed severe LV dysfunction and cardiology is following with recommendations pending.    PAIN:  Are you having pain? No 5/10 Lt heel tight  PRECAUTIONS: None  WEIGHT BEARING RESTRICTIONS: No  FALLS: Has patient fallen in last 6 months? No  LIVING ENVIRONMENT: Lives with: lives with their family Lives in: House/apartment Stairs: Yes: External: 5 steps; on right going up one at a time  PLOF: Independent  PATIENT GOALS: To stop dragging his foot and be stronger on his lt side   OBJECTIVE:    COGNITION: Overall cognitive status: Within functional limits for tasks assessed    LOWER EXTREMITY ROM:   [facility]   LOWER EXTREMITY MMT:    MMT Right Eval 8/23 Left Eval 03/10/23 Right 05/05/23  Hip flexion 5  3+/5 4/5  Hip extension 3+ 3+ 3- 3  Hip abduction 5  4 4   Hip adduction      Hip internal rotation      Hip external rotation      Knee flexion      Knee extension 5/5  3+/5 4/5  Ankle dorsiflexion 5/5  4/5 4+  Ankle plantarflexion      Ankle inversion      Ankle eversion      (Blank rows = not tested) FUNCTIONAL TESTS:  30 seconds chair stand test: 12 ; 11 is poor for pt age and sex  05/05/23: 56; 19 is normal for age and sex  2 minute walk test: 490 ft no assistive device decreased Lt UE motion and noted dragging of Lt foot  05/05/23: 2 minute walk test 482 ft no foot dragging noted  Single leg stance :  RT: 10"; LT: 30" (therapist stopped at 30" Rt:05/05/23: 60",  Lt:60"  PATIENT SURVEYS:  FOTO 65  TODAY'S TREATMENT:                                                                                                                              DATE: 05/30/23:   Standing:  SL heel raise on incline slope 2x10  Toe raise on decline slope 2x 10  3D hip  excursion (squat to lumbar extension, lateral flex with UE overhead, rotate)  Lunge 5x alternating  Slant board 3x 30"   05/18/23: Recumbent bike L1 x 5 min for warm up Seated  Hamstring stretch x30 sec  Figure 4 stretch x30 sec  Wrist extensor stretch x30 sec Standing  Lat stretch against wall x30 sec  QL stretch with doorframe x30 sec  Gastroc stretch on slant board x30 sec  SL heel raise on slant board 2x10  SL toe raise 2x10  Counter squat 3x10  SLS x20"  Runner's step ups 6" step 2x10   05/11/23: Standing: Squat to heel raise 15x Tandem stance on foam 2x 30", 2x with horizontal head turns Vector stance 3x 5" Tandem gait 2RT Posterior lunge 5x Body craft leg press 4Pl 2x 10 Bodycraft side step and retro gait 3PL 5RT    05/09/23: Standing:  Heel raise 15x incline slope Toe raise 15x decline slope with HHA Squats with 2# BUE, DC weight as increased tightness on Lt side Sidestep GTB around thigh 2RT Vector stance 3x 5" intermittent HHA Tandem  stance 2x 30" on floor, 2x 30" on foam; 3rd set head turns on foam   05/05/23 reassessment see above.  Stand: Heel raise x 10 Squat x 10 Supine: SLR x 10 Side lying hip abduction Lt w/3# x 10  Prone hip extension x 10   03/10/23 Evaluation Ankle dorsiflexion with blue theraband x 5 Supine SLR x 5 Prone hip extension x 5  B Sit to stand x 10    PATIENT EDUCATION: Education details: HEP Person educated: Patient Education method: Explanation/handout Education comprehension: returned demonstration  HOME EXERCISE PROGRAM: Access Code: [email or web address] URL: [email or web address] Date: 03/10/2023 Prepared by: Virgina Organ  Exercises - Seated Long Arc Quad  - 2 x daily - 7 x weekly - 1 sets - 10 reps - 5" hold - Sit to Stand with Arms Crossed  - 2 x daily - 7 x weekly - 1 sets - 10 reps - Ankle Dorsiflexion with Resistance  - 2 x daily - 7 x weekly - 1 sets - 10 reps - 5" hold - Supine Active Straight Leg  Raise  - 2 x daily - 7 x weekly - 1 sets - 10 reps - 5" hold - Prone Hip Extension  - 2 x daily - 7 x weekly - 1 sets - 10 reps - 5" hold 05/05/23 - Sidelying Hip Abduction with Resistance at Ankle  - 1 x daily - 7 x weekly - 1 sets - 10 reps - 5" hold - Squat  - 1 x daily - 7 x weekly - 1 sets - 10 reps - 5' hold - Standing Sidebends  - 2 x daily - 7 x weekly - 1 sets - 5 reps - 10 hold  05/09/23: - Side Stepping with Resistance at Thighs and Counter Support  - 1 x daily - 7 x weekly - 3 sets - 10 reps - Standing Tandem Balance with Counter Support  - 2 x daily - 7 x weekly - 3 sets - 3 reps - 30" hold - Standing 3-Way Kick  - 1 x daily - 7 x weekly - 1 sets - 5 reps - 5" hold  GOALS: Goals reviewed with patient? yes  SHORT TERM GOALS: Target date: 04/19/23  PT strength to be increased 1/2 grade to be able to note that he is able to walk for 30 minute prior to any foot dragging  Baseline: 05/18/23: States it is inconsistent -- he can start to feel it drag and then go on without noticing dragging Goal status: ongoing  2.  Pt to be able to SLS on B LE for 20" for decreased risk of falling  Goal status: met   LONG TERM GOALS: Target date: 05/22/23 (pt is going to wait on to find out how much he has to pay from insurance so will not start right awaY)  PT strength to be increased at least a 4+ /5to be able to note that he is able to walk for an hour without  his foot dragging  Baseline:  Goal status: ongoing  2.  PT  to be able to balance for 30" B for reduced risk of falling  Baseline:  Goal status: met  3.  Pt to be able to go up and down 12 steps in a reciprocal manner without UE hold. Baseline:  Goal status: on going   4.  PT to not have a sensation of Lt trunk mm being tight  Goal:  initial   ASSESSMENT:  CLINICAL IMPRESSION: Pt  late for apt today.  Pt c/o tightness on Lt side of body and heel pain.  Added mobility exercises and stretches as well as lunges for functional  strengthening.  Pt tolerated well with session, reports of improved weight bearing tolerance with Lt heel.  OBJECTIVE IMPAIRMENTS: decreased activity tolerance, decreased balance, and decreased strength.   ACTIVITY LIMITATIONS: lifting, standing, squatting, stairs, and locomotion level  PARTICIPATION LIMITATIONS: shopping, community activity, and occupation   REHAB POTENTIAL: Good  CLINICAL DECISION MAKING: Stable/uncomplicated  EVALUATION COMPLEXITY: Low  PLAN:  PT FREQUENCY: 1x/week  PT DURATION: 6 weeks  PLANNED INTERVENTIONS: Therapeutic exercises, Therapeutic activity, Neuromuscular re-education, Balance training, Gait training, Patient/Family education, Self Care, and Joint mobilization  PLAN FOR NEXT SESSION: High balance and strengthening activities give HEP update each visit.  Begin balance beam and lunges next session.  Becky Sax, LPTA/CLT; CBIS [phone]  Juel Burrow, PTA 05/30/2023, 10:05 AM

## 2023-06-01 ENCOUNTER — Ambulatory Visit (HOSPITAL_COMMUNITY): Payer: BC Managed Care – PPO | Admitting: Physical Therapy

## 2023-06-06 ENCOUNTER — Encounter (HOSPITAL_COMMUNITY): Payer: Self-pay

## 2023-06-06 ENCOUNTER — Ambulatory Visit (HOSPITAL_COMMUNITY): Payer: Self-pay

## 2023-06-06 DIAGNOSIS — M6281 Muscle weakness (generalized): Secondary | ICD-10-CM

## 2023-06-06 DIAGNOSIS — R262 Difficulty in walking, not elsewhere classified: Secondary | ICD-10-CM

## 2023-06-06 NOTE — Addendum Note (Signed)
Addended by: Jules Husbands MARIE L on: 06/06/2023 01:25 PM   Modules accepted: Orders

## 2023-06-06 NOTE — Therapy (Addendum)
OUTPATIENT PHYSICAL THERAPY NEURO TREATMENT   Patient Name: Jermaine Shaw MRN: [id number] DOB:1971/01/09, 52 y.o., male Today's Date: 06/06/2023  PHYSICAL THERAPY DISCHARGE SUMMARY  Visits from Start of Care: 7  Current functional level related to goals / functional outcomes: See below   Remaining deficits: See below. Has continued feeling of tightness   Education / Equipment: D/C to HEP   Patient agrees to discharge. Patient goals were partially met. Patient is being discharged due to being pleased with the current functional level.   PCP: Maurilio Lovely REFERRING PROVIDER: Maurilio Lovely D, DO Next apt with PCP 06/06/23  END OF SESSION:  PT End of Session - 06/06/23 0726     Visit Number 7    Number of Visits 8    Date for PT Re-Evaluation 06/04/23    Authorization Type BCBS    Progress Note Due on Visit 10    PT Start Time 0716    PT Stop Time 0755    PT Time Calculation (min) 39 min                   Past Medical History:  Diagnosis Date   Acute ischemic stroke (HCC)    Heart failure with reduced ejection fraction (HCC)    Hyperlipidemia    Left-sided weakness    Past Surgical History:  Procedure Laterality Date   BACK SURGERY     Patient Active Problem List   Diagnosis Date Noted   Stroke (HCC) 02/01/2023   HFrEF (heart failure with reduced ejection fraction) (HCC) 01/31/2023   CVA (cerebral vascular accident) (HCC) 01/30/2023    ONSET DATE: 01/30/23  REFERRING DIAG:  Diagnosis  I63.9 (ICD-10-CM) - Cerebrovascular accident (CVA), unspecified mechanism (HCC)    THERAPY DIAG:  Muscle weakness (generalized)  Difficulty in walking, not elsewhere classified  Rationale for Evaluation and Treatment: Rehabilitation  SUBJECTIVE:                                                                                                                                                                                             SUBJECTIVE STATEMENT:    Reports the left side of his body side, arm and face have increased tingling some days, reports he feels tight on Left side this morning.    PERTINENT HISTORY: 52 y.o. male with medical history significant for tobacco abuse who presented to the ED with left arm numbness and heaviness.  Patient was admitted with acute ischemic CVA and neurology has placed recommendations for discharge.  Unfortunately, 2D echocardiogram is now showing newly diagnosed severe LV dysfunction and cardiology is following with recommendations pending.  PAIN:  Are you having pain? No tightness on left side  PRECAUTIONS: None  WEIGHT BEARING RESTRICTIONS: No  FALLS: Has patient fallen in last 6 months? No  LIVING ENVIRONMENT: Lives with: lives with their family Lives in: House/apartment Stairs: Yes: External: 5 steps; on right going up one at a time  PLOF: Independent  PATIENT GOALS: To stop dragging his foot and be stronger on his lt side   OBJECTIVE:    COGNITION: Overall cognitive status: Within functional limits for tasks assessed    LOWER EXTREMITY ROM:   [facility]   LOWER EXTREMITY MMT:    MMT Right Eval 8/23 Right 06/06/23 Left Eval 03/10/23 Right 05/05/23 Left 06/06/23  Hip flexion 5   3+/5 4/5 5/5  Hip extension 3+ 3+ 4/5 3- 3 4/5  Hip abduction 5  5/5 4 4  4+/5  Hip adduction        Hip internal rotation        Hip external rotation        Knee flexion        Knee extension 5/5  5/5 3+/5 4/5 4+/5  Ankle dorsiflexion 5/5  5/5 4/5 4+ 5/5  Ankle plantarflexion        Ankle inversion        Ankle eversion        (Blank rows = not tested) FUNCTIONAL TESTS:  30 seconds chair stand test: 12 ; 11 is poor for pt age and sex  05/05/23: 12; 19 is normal for age and sex  2 minute walk test: 490 ft no assistive device decreased Lt UE motion and noted dragging of Lt foot  05/05/23: 2 minute walk test 482 ft no foot dragging noted  Single leg stance :  RT: 10"; LT: 30" (therapist stopped at  30" Rt:05/05/23: 60",  Lt:60" 06/06/23: 566ft no feet dragging.   PATIENT SURVEYS:  FOTO 65  06/06/23:  76%  TODAY'S TREATMENT:                                                                                                                              DATE:  06/06/23: 542 no AD no feet dragging 3D hip excursion (squat to lumbar extension, lateral flex with UE overhead, rotate) 30 seconds chair stand test:  13; 19 is normal for age and sex  Stairs 3RT reciprocal pattern no HR FOTO 76% MMT Child's pose 3x 30"  05/30/23:   Standing:  SL heel raise on incline slope 2x10  Toe raise on decline slope 2x 10  3D hip excursion (squat to lumbar extension, lateral flex with UE overhead, rotate)  Lunge 5x alternating  Slant board 3x 30"   05/18/23: Recumbent bike L1 x 5 min for warm up Seated  Hamstring stretch x30 sec  Figure 4 stretch x30 sec  Wrist extensor stretch x30 sec Standing  Lat stretch against wall x30 sec  QL stretch with doorframe x30 sec  Gastroc  stretch on slant board x30 sec  SL heel raise on slant board 2x10  SL toe raise 2x10  Counter squat 3x10  SLS x20"  Runner's step ups 6" step 2x10   05/11/23: Standing: Squat to heel raise 15x Tandem stance on foam 2x 30", 2x with horizontal head turns Vector stance 3x 5" Tandem gait 2RT Posterior lunge 5x Body craft leg press 4Pl 2x 10 Bodycraft side step and retro gait 3PL 5RT    05/09/23: Standing:  Heel raise 15x incline slope Toe raise 15x decline slope with HHA Squats with 2# BUE, DC weight as increased tightness on Lt side Sidestep GTB around thigh 2RT Vector stance 3x 5" intermittent HHA Tandem stance 2x 30" on floor, 2x 30" on foam; 3rd set head turns on foam   05/05/23 reassessment see above.  Stand: Heel raise x 10 Squat x 10 Supine: SLR x 10 Side lying hip abduction Lt w/3# x 10  Prone hip extension x 10   03/10/23 Evaluation Ankle dorsiflexion with blue theraband x 5 Supine SLR x  5 Prone hip extension x 5  B Sit to stand x 10    PATIENT EDUCATION: Education details: HEP Person educated: Patient Education method: Explanation/handout Education comprehension: returned demonstration  HOME EXERCISE PROGRAM: Access Code: [email or web address] URL: [email or web address] Date: 03/10/2023 Prepared by: Virgina Organ  Exercises - Seated Long Arc Quad  - 2 x daily - 7 x weekly - 1 sets - 10 reps - 5" hold - Sit to Stand with Arms Crossed  - 2 x daily - 7 x weekly - 1 sets - 10 reps - Ankle Dorsiflexion with Resistance  - 2 x daily - 7 x weekly - 1 sets - 10 reps - 5" hold - Supine Active Straight Leg Raise  - 2 x daily - 7 x weekly - 1 sets - 10 reps - 5" hold - Prone Hip Extension  - 2 x daily - 7 x weekly - 1 sets - 10 reps - 5" hold 05/05/23 - Sidelying Hip Abduction with Resistance at Ankle  - 1 x daily - 7 x weekly - 1 sets - 10 reps - 5" hold - Squat  - 1 x daily - 7 x weekly - 1 sets - 10 reps - 5' hold - Standing Sidebends  - 2 x daily - 7 x weekly - 1 sets - 5 reps - 10 hold  05/09/23: - Side Stepping with Resistance at Thighs and Counter Support  - 1 x daily - 7 x weekly - 3 sets - 10 reps - Standing Tandem Balance with Counter Support  - 2 x daily - 7 x weekly - 3 sets - 3 reps - 30" hold - Standing 3-Way Kick  - 1 x daily - 7 x weekly - 1 sets - 5 reps - 5" hold  06/06/23:  3D hip excursion  Child's pose   GOALS: Goals reviewed with patient? yes  SHORT TERM GOALS: Target date: 04/19/23  PT strength to be increased 1/2 grade to be able to note that he is able to walk for 30 minute prior to any foot dragging  Baseline: 05/18/23: States it is inconsistent -- he can start to feel it drag and then go on without noticing dragging 06/06/23:  See MMT above; reports ability to ambulate 20-30 minutes with occasional Lt foot drag. Goal status: partially met  2.  Pt to be able to SLS on B LE for 20" for decreased risk of falling  Goal status: met   LONG  TERM GOALS: Target date: 05/22/23 (pt is going to wait on to find out how much he has to pay from insurance so will not start right awaY)  PT strength to be increased at least a 4+ /5to be able to note that he is able to walk for an hour without  his foot dragging  Baseline: 06/06/23:  see above for MMT; reports he walks at the track for 20-30 minutes for exercise, does have occasional foot drag Goal status: partially met  2.  PT  to be able to balance for 30" B for reduced risk of falling  Baseline:  Goal status: met  3.  Pt to be able to go up and down 12 steps in a reciprocal manner without UE hold. Baseline:  Goal status: met   4.  PT to not have a sensation of Lt trunk mm being tight   Baseline:  06/06/23:  Reports is a daily issue, reports constant pressure and some times difficult to breath. Goal:  initial   ASSESSMENT:  CLINICAL IMPRESSION: Reviewed goals this session with the following findings:  Pt has met 1/2 STG and 2/4 LTGs.  Presents with faster cadence and improve strength noted with MMT.  Pt stated he continues to have tightness on Lt side of face, trunk and arm and occasional Lt foot drag.  Pt reports compliance with additional stretches with HEP and strength is 5/5 with anterior tib.  Pt with MD apt later today and going to discuss medication with doctor to assist with tightness.  Following discussion decision made to DC to HEP.  Discussed exercises to continue at home, added 3D hip excursion and child's pose to exercise program.  OBJECTIVE IMPAIRMENTS: decreased activity tolerance, decreased balance, and decreased strength.   ACTIVITY LIMITATIONS: lifting, standing, squatting, stairs, and locomotion level  PARTICIPATION LIMITATIONS: shopping, community activity, and occupation   REHAB POTENTIAL: Good  CLINICAL DECISION MAKING: Stable/uncomplicated  EVALUATION COMPLEXITY: Low  PLAN:  PT FREQUENCY: 1x/week  PT DURATION: 6 weeks  PLANNED INTERVENTIONS:  Therapeutic exercises, Therapeutic activity, Neuromuscular re-education, Balance training, Gait training, Patient/Family education, Self Care, and Joint mobilization  PLAN FOR NEXT SESSION: DC to HEP.    Becky Sax, LPTA/CLT; CBIS [phone]  [facility] Dell Ponto, PT 06/06/2023, 8:11 AM

## 2023-06-09 ENCOUNTER — Ambulatory Visit (HOSPITAL_COMMUNITY): Payer: BC Managed Care – PPO

## 2023-07-05 ENCOUNTER — Ambulatory Visit (INDEPENDENT_AMBULATORY_CARE_PROVIDER_SITE_OTHER): Payer: Self-pay | Admitting: Adult Health

## 2023-07-05 ENCOUNTER — Encounter: Payer: Self-pay | Admitting: Adult Health

## 2023-07-05 VITALS — BP 127/81 | HR 95 | Ht 69.0 in | Wt 156.0 lb

## 2023-07-05 DIAGNOSIS — I6350 Cerebral infarction due to unspecified occlusion or stenosis of unspecified cerebral artery: Secondary | ICD-10-CM

## 2023-07-05 NOTE — Progress Notes (Signed)
[facility] [address] [place]. [place] [address] [phone]       OFFICE CONSULT NOTE  Mr. Jermaine Shaw Date of Birth:  [birth year] Medical Record Number:  [id number]   Primary neurologist: Dr. Pearlean Brownie Reason for visit: Stroke follow-up   Chief Complaint  Patient presents with   Follow-up    Rm 3, here alone Pt is here for stroke follow up. States he is having a numbing/tingly feeling on the left side of face/left cheek. Pt states he has a tight feeling on his left side of his arm/back. States has not regained complete mobility on his left arm.      HPI:   Update 07/05/2023 JM: Patient returns for stroke follow-up unaccompanied  Reports continued left-sided numbness and tightness sensation with some improvement since prior visit. Reports constant numbness in left arm and leg, can have tightness sensation in left wrist and lower back, facial numbness intermittent. Previously tried muscle relaxant by PCP but denies benefit.  Completed PT last month, continues to do HEP. Denies new stroke/TIA symptoms.   Compliant on aspirin and atorvastatin, denies side effects Routinely follows with PCP for stroke risk factor management Routinely follows with cardiology    History provided for reference purposes only Consult visit 03/08/2023 Dr. Pearlean Brownie: Jermaine Shaw is a 52 year old African-American male seen today for initial office consultation visit for stroke.  History is obtained from the patient and review of electronic medical records and I personally reviewed pertinent available imaging films in PACS.  He has past medical history of hyperlipidemia and congestive heart failure.  He presented to [facility] on 01/30/2023 after waking up from sleep at 1:15 AM and noticing that left side of the body felt numb as if it is gone to sleep.  When seen in the emergency room his blood pressure was elevated at 195/90.  He presented outside time window for thrombolysis.  CT  head on admission was unremarkable but MRI scan showed a small punctate right paramedian pontine infarct as well as a subacute left corona radiata lacunar infarct as well.  MR angiogram of the brain showed no large vessel stenosis but mild intracranial atherosclerosis.  There was a small 3 to 4 mm distal right ICA saccular infundibulum versus small aneurysm.  MRI scan cervical spine showed mild disc degenerative changes and spinal stenosis at C3-4 and C 70 5-6 but no significant compression.  LDL cholesterol was 109 mg percent.  Hemoglobin A1c 6.2.  Echocardiogram showed reduced ejection fraction of 30 to 35% with global hypokinesis.  Left atrial size was normal.  Carotid ultrasound showed bilateral less than 50% ICA stenosis.  Patient was started on Plavix for 3 weeks followed by aspirin alone.  He states he is doing well.  He still has some mild intermittent paresthesias in the left side which have not completely resolved.  In fact saw his PCP recently for 4 paresthesias and was given a trial of muscle relaxants which did not help.  He is also followed up with a cardiologist for his heart failure.  He is currently wearing a 30-day heart monitor which he has to return next week.  He has cut back smoking from 1 pack/day to now 6 cigarettes a day and plans to eventually quit.  Does not drink alcohol.  He has chronic left shoulder pain following child shoulder surgery.  States he is walking good but occasionally drags left leg when he gets tired.  He is tolerating aspirin well without bruising  or bleeding and Lipitor without muscle aches and pains.  He states his blood pressure is under good control.   ROS:   14 system review of systems is positive for those listed in HPI and all other systems negative  PMH:  Past Medical History:  Diagnosis Date   Acute ischemic stroke (HCC)    Heart failure with reduced ejection fraction (HCC)    Hyperlipidemia    Left-sided weakness     Social History:  Social  History   Socioeconomic History   Marital status: Married    Spouse name: Not on file   Number of children: Not on file   Years of education: Not on file   Highest education level: Not on file  Occupational History   Not on file  Tobacco Use   Smoking status: Every Day    Current packs/day: 0.50    Types: Cigarettes   Smokeless tobacco: Not on file  Vaping Use   Vaping status: Never Used  Substance and Sexual Activity   Alcohol use: Not Currently   Drug use: No   Sexual activity: Yes  Other Topics Concern   Not on file  Social History Narrative   Not on file   Social Determinants of Health   Financial Resource Strain: Not on file  Food Insecurity: Not on file  Transportation Needs: Not on file  Physical Activity: Not on file  Stress: Not on file  Social Connections: Not on file  Intimate Partner Violence: Not on file    Medications:   Current Outpatient Medications on File Prior to Visit  Medication Sig Dispense Refill   aspirin EC 81 MG tablet Take 1 tablet (81 mg total) by mouth daily. Swallow whole. 30 tablet 12   atorvastatin (LIPITOR) 40 MG tablet Take 1 tablet (40 mg total) by mouth daily. 30 tablet 3   Blood Pressure Monitor MISC 1 each by Does not apply route as directed. Adult BP monitor dx: hypertension & stroke 1 each 0   dapagliflozin propanediol (FARXIGA) 10 MG TABS tablet Take 1 tablet (10 mg total) by mouth daily before breakfast. 30 tablet 6   famotidine (PEPCID) 40 MG tablet Take 1 tablet (40 mg total) by mouth every evening. 30 tablet 1   metoprolol tartrate (LOPRESSOR) 25 MG tablet Take 1 tablet (25 mg total) by mouth 2 (two) times daily. 180 tablet 3   sacubitril-valsartan (ENTRESTO) 24-26 MG Take 1 tablet by mouth 2 (two) times daily. 60 tablet 2   spironolactone (ALDACTONE) 25 MG tablet Take 1 tablet (25 mg total) by mouth daily. 90 tablet 2   No current facility-administered medications on file prior to visit.    Allergies:  No Known  Allergies  Physical Exam Today's Vitals   07/05/23 1313  BP: 127/81  Pulse: 95  Weight: 156 lb (70.8 kg)  Height: 5\' 9"  (1.753 m)   Body mass index is 23.04 kg/m.   General: well developed, well nourished very pleasant middle-aged African-American male, seated, in no evident distress  Neurologic Exam Mental Status: Awake and fully alert. Oriented to place and time. Recent and remote memory intact. Attention span, concentration and fund of knowledge appropriate. Mood and affect appropriate.  Cranial Nerves: Pupils equal, briskly reactive to light. Extraocular movements full without nystagmus. Visual fields full to confrontation. Hearing intact.  Decreased left lower facial sensation to light touch. Face, tongue, palate moves normally and symmetrically.  Motor: Normal bulk and tone. Normal strength in all tested extremity muscles.  Diminished fine finger movements on the left.  Orbits right over left upper extremity.  Left shoulder elevation limited due to mechanical restriction from prior surgery and chronic pain Sensory.: intact to touch , pinprick , position and vibratory sensation reports sensation with light touch in LUE compared to RUE.  Coordination: Rapid alternating movements normal in all extremities except slightly impaired left hand. Finger-to-nose and heel-to-shin performed accurately bilaterally. Gait and Station: Arises from chair without difficulty. Stance is normal.  Gait demonstrates normal stride length and step height without use of AD. Reflexes: 1+ and symmetric. Toes downgoing.        ASSESSMENT/PLAN: 52 year old African-American male with left body paresthesias from right pontine lacunar stroke as well as subacute left corona radiator lacunar stroke in May 2024 due to small vessel disease.  Vascular risk factors of hypertension, hyperlipidemia, smoking, cardiomyopathy and borderline diabetes.   1.  Right pontine stroke 2.  Left CR stroke  -Residual deficits of  left sided paresthesias, discussed trial of gabapentin for tightness sensation of wrist and lower back, wishes to further discuss with PCP. Continue HEP -Continue aspirin 81 mg daily and atorvastatin 80 mg daily for secondary stroke prevention measures managed prescribed by PCP  -Continue close PCP follow-up for aggressive stroke risk factor management including BP goal<130/90, HLD with LDL goal<70 and pre-DM with A1c.<7      Doing well from stroke standpoint without further recommendations and risk factors are managed by PCP. He may follow up PRN, as usual for our patients who are strictly being followed for stroke. If any new neurological issues should arise, request PCP place referral for evaluation by one of our neurologists. Thank you.      I spent 30 minutes of face-to-face and non-face-to-face time with patient.  This included previsit chart review, lab review, study review, order entry, electronic health record documentation, patient education and discussion regarding above diagnoses and treatment plan and answered all other questions to patient's satisfaction  Ihor Austin, [facility]  [facility] [address] [place], [place] [address]  Phone [phone] Fax [phone] Note: This document was prepared with digital dictation and possible smart phrase technology. Any transcriptional errors that result from this process are unintentional.

## 2023-07-05 NOTE — Patient Instructions (Addendum)
Continue routine exercises as advised during therapy sessions  Consider use of gabapentin to help with pain symptoms after your stroke   Continue aspirin 81 mg daily  and atorvastatin for secondary stroke prevention  Continue to follow up with PCP regarding blood pressure and cholesterol management  Maintain strict control of hypertension with blood pressure goal below 130/90 and cholesterol with LDL cholesterol (bad cholesterol) goal below 70 mg/dL.   Signs of a Stroke? Follow the BEFAST method:  Balance Watch for a sudden loss of balance, trouble with coordination or vertigo Eyes Is there a sudden loss of vision in one or both eyes? Or double vision?  Face: Ask the person to smile. Does one side of the face droop or is it numb?  Arms: Ask the person to raise both arms. Does one arm drift downward? Is there weakness or numbness of a leg? Speech: Ask the person to repeat a simple phrase. Does the speech sound slurred/strange? Is the person confused ? Time: If you observe any of these signs, call 911.       Thank you for coming to see [place] at [facility]. I hope we have been able to provide you high quality care today.  You may receive a patient satisfaction survey over the next few weeks. We would appreciate your feedback and comments so that we may continue to improve ourselves and the health of our patients.   Sensory Loss After a Stroke A stroke can damage the parts of your brain that control your body's senses. This may affect your ability to see, taste, swallow, smell, or feel touch or pressure. You may also have trouble feeling temperature changes or moving your body in a coordinated way. You may have problems with all of your senses or only some of them. A treatment plan may help you get some of your lost senses back. It can also help you manage the changes to your lifestyle. How does a stroke affect me? You may have trouble swallowing after a stroke. This may  be because of: Changes in your muscles. Sensory changes. These may include: Trouble feeling the consistency or size of a piece of food in your mouth. Not feeling the need to clear your throat. Your risk of falling is higher after a stroke. You may have trouble feeling your legs and feet or coordinating your movements. You may also have trouble doing basic tasks. What actions can I take to manage my condition? Work with your health care provider Work with your health care provider to come up with a treatment plan. Ask your provider if you might benefit from: Physical, occupational, or speech therapy. The use of a device to help you move around (assistive device), such as a wheelchair or walker. Change how you eat and drink  Follow instructions from your provider about what you may eat and drink. If you have trouble swallowing: Take smaller bites. Chew your food well. Make sure you have swallowed all the food in your mouth before you take another bite. Sit upright when you eat or drink. Avoid distractions. Stay upright for 30-45 minutes after you eat. Change the texture of some foods and drinks. This may include: Eating foods that have been made smooth or mashed (pureed). Using thickening liquids. Prevent falls Your risk of falls is higher after a stroke. To lower your risk of falls: Wear eyeglasses when you move around. Turn on lights, or use night lights, to avoid having to move around in the  dark. Use grab bars in bathrooms and handrails in stairways. Keep walkways clear in your home. This may mean removing area rugs, cords, and clutter from the floor. Prevent burns Your risk of getting burned is higher after a stroke. To lower your risk of burns: Test the water temperature before you take a shower or wash your hands. Allow hot foods to cool slightly before eating. Use potholders when you handle hot pans. Manage stress A stroke is a life-changing event that can cause a lot of  stress. Talk with your provider if you feel overwhelmed. You may want to: Join a local support group. Ask your provider or physical therapist about groups near you. Find a Veterinary surgeon. Exercise often. This can help relieve stress. It can also help you sleep better. Follow these instructions at home: Activity Do physical and occupational therapy. These can help your recovery. Avoid spending too much time sitting or lying down. If you must be in a chair or bed, change positions often. Return to your normal activities as told by your provider. Ask your provider what activities are safe for you. Lifestyle Get help at home as needed. You may need help getting dressed, bathing, using the bathroom, eating, or doing other activities. Do not use any products that contain nicotine or tobacco. These products include cigarettes, chewing tobacco, and vaping devices, such as e-cigarettes. If you need help quitting, ask your provider. Do not drink alcohol. Safety If one hand was affected by your stroke, do not use that hand to handle sharp objects, such as scissors or knives. Use your healthy hand instead. Infection signs Check the side affected by the stroke every day for cuts, bruises, or injuries that you did not feel. If you do have a cut or wound, check it for signs of infection. Check for: Redness or swelling. Fluid or blood. Warmth. Pus or a bad smell. General instructions Take over-the-counter and prescription medicines only as told by your provider. Wear arm or leg braces as told by your provider. Where to find support To find a local support group, search the American Stroke Association group finder: stroke.org Contact a health care provider if: You have a wound or burn that will not heal. You have any signs of infection in a wound or cut. You are unsteady and almost fall. You have side effects from your medicines. You become depressed or anxious. Get help right away if: You have an  allergic reaction to your medicine. You fall while you are taking blood thinners. This can lead to serious bleeding in or around the brain. You have symptoms that may mean a new stroke. These may include weakness or numbness of your arm or leg, speech changes, or facial drooping. These symptoms may be an emergency. Get help right away. Call 911. Do not wait to see if the symptoms will go away. Do not drive yourself to the hospital. Also, get help right away if: You have feelings of hurting yourself or others. Take one of these steps if you feel like you may hurt yourself or others, or have thoughts about taking your own life: Go to your nearest emergency room. Call 911. Call the National Suicide Prevention Lifeline at 901-317-3272 or 988. This is open 24 hours a day. Text the Crisis Text Line at 6571213829. This information is not intended to replace advice given to you by your health care provider. Make sure you discuss any questions you have with your health care provider. Document Revised: 06/20/2022 Document Reviewed:  06/20/2022 Elsevier Patient Education  2024 [employer].

## 2023-08-01 ENCOUNTER — Encounter: Payer: Self-pay | Admitting: Nurse Practitioner

## 2023-08-01 ENCOUNTER — Ambulatory Visit: Payer: BC Managed Care – PPO | Attending: Nurse Practitioner | Admitting: Nurse Practitioner

## 2023-08-01 VITALS — BP 140/90 | HR 84 | Ht 69.0 in | Wt 163.6 lb

## 2023-08-01 DIAGNOSIS — R202 Paresthesia of skin: Secondary | ICD-10-CM | POA: Diagnosis not present

## 2023-08-01 DIAGNOSIS — Z8673 Personal history of transient ischemic attack (TIA), and cerebral infarction without residual deficits: Secondary | ICD-10-CM | POA: Diagnosis not present

## 2023-08-01 DIAGNOSIS — R0789 Other chest pain: Secondary | ICD-10-CM

## 2023-08-01 DIAGNOSIS — Z720 Tobacco use: Secondary | ICD-10-CM

## 2023-08-01 DIAGNOSIS — I5020 Unspecified systolic (congestive) heart failure: Secondary | ICD-10-CM | POA: Diagnosis not present

## 2023-08-01 DIAGNOSIS — I10 Essential (primary) hypertension: Secondary | ICD-10-CM

## 2023-08-01 MED ORDER — SACUBITRIL-VALSARTAN 24-26 MG PO TABS: 1.0000 | ORAL_TABLET | Freq: Two times a day (BID) | ORAL | 0 refills | Status: DC

## 2023-08-01 NOTE — Patient Instructions (Addendum)
Medication Instructions:  Your physician recommends that you continue on your current medications as directed. Please refer to the Current Medication list given to you today.  Labwork: None   Testing/Procedures: None   Follow-Up: Your physician recommends that you schedule a follow-up appointment in: 4-6 weeks   Any Other Special Instructions Will Be Listed Below (If Applicable).  If you need a refill on your cardiac medications before your next appointment, please call your pharmacy.

## 2023-08-01 NOTE — Progress Notes (Unsigned)
Cardiology Office Note:  .   Date:  08/01/2023 ID:  [name], DOB Nov 23, 1970, MRN [id number] PCP: [facility]  [facility] Providers Cardiologist:  Dietrich Pates, MD Cardiology APP:  Sharlene Dory, NP    History of Present Illness: .   Jermaine Shaw is a 52 y.o. male with a PMH of ischemic stroke, HLD, HFrEF, HTN, and tobacco abuse, who presents today for follow-up.   I last saw patient on February 15, 2023 for hospital follow-up. Continued to note left sided numbness after his stroke in May 2024. Was overall doing well from a cardiac perspective. GDMT titrated for HFrEF.   ED visit for HTN and HA, SBP 170, CT head performed nothing acute.   03/21/2023 - Today he presents for follow-up. He states he is doing well. No longer on Plavix. Tolerating meds well and complian with his medications. Weight is stable, did have episode of poor appetite but this has improved. Denies any chest pain, shortness of breath, palpitations, syncope, presyncope, dizziness, orthopnea, PND, swelling or significant weight changes, acute bleeding, or claudication. Shows me BP log that reveals elevated readings. Currently wearing 30 day event monitor. Saw for telephone visit 04/2023. Was doing well, and his BP was overall well controlled.   08/01/2023 - Presents for follow-up.  Patient has had some difficulty with his medications since I last saw him.  He reports that he is taking his SGLT2 inhibitor, but prices going up.  He has been out of Lopressor and has not been taking Entresto since he has not had this medication since October.  Also says he needs a refill on spironolactone.  Denies any chest pain, admits to squeezing/tight sensation along left lateral chest/left lateral thoracic area with tingling sensation that goes down his left arm, says this has been going on since he had a stroke.  His symptoms remained stable over time. Typically noticed with movement and with reaching for an item. Says  occasionally his left leg feels heavy when walking. Denies any chest pain, shortness of breath, palpitations, syncope, presyncope, dizziness, orthopnea, PND, swelling or significant weight changes, acute bleeding, or claudication.  Studies Reviewed: .    Cardiac monitor 04/2023:  Rhythm:  Sinus rhythm   Rare PAC, PVC.    SHort burst of VT (10 beats) Triggered events correlated with sinus rhythm, sinus tachycardia   Echo 01/2023: 1. Left ventricular ejection fraction, by estimation, is 30 to 35%. The  left ventricle has moderately decreased function. The left ventricle  demonstrates global hypokinesis. Left ventricular diastolic parameters are  consistent with Grade I diastolic  dysfunction (impaired relaxation).   2. Right ventricular systolic function is normal. The right ventricular  size is normal. There is normal pulmonary artery systolic pressure.   3. The mitral valve is normal in structure. Mild mitral valve  regurgitation. No evidence of mitral stenosis.   4. The tricuspid valve is abnormal.   5. The aortic valve is tricuspid. Aortic valve regurgitation is not  visualized. No aortic stenosis is present.   6. The inferior vena cava is normal in size with greater than 50%  respiratory variability, suggesting right atrial pressure of 3 mmHg.   Carotid duplex 01/2023: IMPRESSION: 1. Bilateral carotid bifurcation plaque resulting in less than 50% diameter ICA stenosis. 2. Antegrade bilateral vertebral arterial flow.  Physical Exam:   VS:  BP (!) 140/90 (BP Location: Left Arm)   Pulse 84   Ht 5\' 9"  (1.753 m)   Wt 163  lb 9.6 oz (74.2 kg)   SpO2 97%   BMI 24.16 kg/m    Wt Readings from Last 3 Encounters:  08/01/23 163 lb 9.6 oz (74.2 kg)  07/05/23 156 lb (70.8 kg)  05/01/23 153 lb (69.4 kg)    GEN: Well nourished, well developed in no acute distress NECK: No JVD; No carotid bruits CARDIAC: S1/S2, RRR, no murmurs, rubs, gallops RESPIRATORY:  Clear to auscultation without  rales, wheezing or rhonchi  ABDOMEN: Soft, non-tender, non-distended EXTREMITIES:  No edema; No deformity   ASSESSMENT AND PLAN: .    HFrEF, medication management Stage C, NYHA class I symptoms. Etiology unclear. Euvolemic and well compensated on exam. Has been out of a few medications. Will provide assistance with medications and provide refills for him today. Instructed to take [place], Lopressor, Entresto, and Aldactone.  Low sodium diet, fluid restriction <2L, and daily weights encouraged. Educated to contact our office for weight gain of 2 lbs overnight or 5 lbs in one week. Plan to update Echo once GDMT is optimal. Will route note to cardiologist to see if cardiac cath needs to be pursued.  Care and ED precautions discussed.   2. Hx of ischemic stroke, left arm tingling, exertional atypical chest pain Denies any new or worsening symptoms, does continue to note squeezing/tight sensation with exertion ever since his stroke along left lateral chest/rib cage area, does not sound cardiac in etiology as he denies any chest pain.  No indication for ischemic evaluation at this time. Will route note to cardiologist for recs. Continue ASA and atorvastatin. Heart healthy diet and regular cardiovascular exercise encouraged. Care and ED precautions discussed.    3. HTN BP elevated and not at goal in setting of being out of a few medications. Will provide refills for hm today. Continue Entresto, Lopressor, and Aldactone. Discussed to monitor BP at home at least 2 hours after medications and sitting for 5-10 minutes. Given BP log and salty six. Heart healthy diet and regular cardiovascular exercise encouraged.    4. Tobacco abuse Smoking cessation encouraged and discussed.   Dispo: Follow-up with me or APP visit in 4-6 weeks.   Signed, Sharlene Dory, NP

## 2023-08-03 ENCOUNTER — Telehealth: Payer: Self-pay | Admitting: Nurse Practitioner

## 2023-08-03 DIAGNOSIS — I5020 Unspecified systolic (congestive) heart failure: Secondary | ICD-10-CM

## 2023-08-03 NOTE — Telephone Encounter (Signed)
-----   Message from Sharlene Dory sent at 08/03/2023  9:04 AM EST ----- Let's have him obtain a BMET in 7-10 days once he has restarted those medications. Last labs looked good, but want to double check after he restarts these medications. Thanks!   Best,  Sharlene Dory, NP

## 2023-08-03 NOTE — Telephone Encounter (Signed)
Mailing lab orders to patient to be done after thanksgiving  Patient informed and verbalized understanding of plan.

## 2023-08-07 ENCOUNTER — Telehealth: Payer: Self-pay | Admitting: Nurse Practitioner

## 2023-08-07 NOTE — Telephone Encounter (Signed)
Patient needs to provide more information for PAF

## 2023-08-17 LAB — BASIC METABOLIC PANEL
BUN/Creatinine Ratio: 11 (ref 9–20)
BUN: 13 mg/dL (ref 6–24)
CO2: 20 mmol/L (ref 20–29)
Calcium: 10.1 mg/dL (ref 8.7–10.2)
Chloride: 105 mmol/L (ref 96–106)
Creatinine, Ser: 1.21 mg/dL (ref 0.76–1.27)
Glucose: 135 mg/dL — ABNORMAL HIGH (ref 70–99)
Potassium: 4.2 mmol/L (ref 3.5–5.2)
Sodium: 144 mmol/L (ref 134–144)
eGFR: 71 mL/min/{1.73_m2} (ref >59)

## 2023-08-31 ENCOUNTER — Other Ambulatory Visit: Payer: Self-pay | Admitting: *Deleted

## 2023-08-31 MED ORDER — DAPAGLIFLOZIN PROPANEDIOL 10 MG PO TABS: 10.0000 mg | ORAL_TABLET | Freq: Every day | ORAL | 6 refills | Status: DC

## 2023-09-07 ENCOUNTER — Encounter: Payer: Self-pay | Admitting: Nurse Practitioner

## 2023-09-07 ENCOUNTER — Ambulatory Visit: Payer: BC Managed Care – PPO | Attending: Nurse Practitioner | Admitting: Nurse Practitioner

## 2023-09-07 VITALS — BP 128/84 | HR 99 | Ht 69.0 in | Wt 162.6 lb

## 2023-09-07 DIAGNOSIS — I10 Essential (primary) hypertension: Secondary | ICD-10-CM

## 2023-09-07 DIAGNOSIS — Z8673 Personal history of transient ischemic attack (TIA), and cerebral infarction without residual deficits: Secondary | ICD-10-CM

## 2023-09-07 DIAGNOSIS — Z720 Tobacco use: Secondary | ICD-10-CM

## 2023-09-07 DIAGNOSIS — R0789 Other chest pain: Secondary | ICD-10-CM | POA: Diagnosis not present

## 2023-09-07 DIAGNOSIS — Z79899 Other long term (current) drug therapy: Secondary | ICD-10-CM

## 2023-09-07 DIAGNOSIS — I5020 Unspecified systolic (congestive) heart failure: Secondary | ICD-10-CM

## 2023-09-07 MED ORDER — ENTRESTO 49-51 MG PO TABS: 1.0000 | ORAL_TABLET | Freq: Two times a day (BID) | ORAL | 0 refills | Status: DC

## 2023-09-07 MED ORDER — ENTRESTO 49-51 MG PO TABS: 1.0000 | ORAL_TABLET | Freq: Two times a day (BID) | ORAL | 5 refills | Status: DC

## 2023-09-07 NOTE — Patient Instructions (Addendum)
Medication Instructions:  Your physician has recommended you make the following change in your medication:  Please Increase Entresto to 49-51 Mg twice daily   Labwork: In 1-2 weeks   Testing/Procedures: None   Follow-Up: Your physician recommends that you schedule a follow-up appointment in: 2-3 months   Any Other Special Instructions Will Be Listed Below (If Applicable). Sherryll Burger PAF given for 2025   If you need a refill on your cardiac medications before your next appointment, please call your pharmacy.

## 2023-09-07 NOTE — Progress Notes (Addendum)
Cardiology Office Note:  .   Date:  09/07/2023 ID:  Jermaine Shaw, DOB Jan 27, 1971, MRN [id number] PCP: [facility]  [facility] Providers Cardiologist:  Jermaine Pates, MD Cardiology APP:  Jermaine Dory, NP    History of Present Illness: .   Jermaine Shaw is a 52 y.o. male with a PMH of ischemic stroke, HLD, HFrEF, HTN, and tobacco abuse, who presents today for follow-up.   I last saw patient on February 15, 2023 for hospital follow-up. Continued to note left sided numbness after his stroke in May 2024. Was overall doing well from a cardiac perspective. GDMT titrated for HFrEF.   ED visit for HTN and HA, SBP 170, CT head performed nothing acute.   I last saw him for follow-up on August 01, 2023. Patient had some difficulty with his medications. Denied any chest pain, but admitted to squeezing/tight sensation along left lateral chest/left lateral thoracic area with tingling sensation that went down his left arm, had been ongoing since his stroke. Typically noticed with movement and with reaching for an item.   Today he presents for follow-up.  He is doing well, and denies any changes since I last saw him in the office.  He is doing well on his medications.  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  Shows me his home BP log that reveals SBP averaging 120s with occasional SBP readings in 150s/160s. Denies any chest pain, shortness of breath, palpitations, syncope, presyncope, dizziness, orthopnea, PND, swelling or significant weight changes, acute bleeding, or claudication.   Studies Reviewed: Marland Kitchen    EKG: EKG is not ordered today.  Cardiac monitor 04/2023:  Rhythm:  Sinus rhythm   Rare PAC, PVC.    SHort burst of VT (10 beats) Triggered events correlated with sinus rhythm, sinus tachycardia   Echo 01/2023: 1.  Left ventricular ejection fraction, by estimation, is 30 to 35%. The  left ventricle has moderately decreased function. The left ventricle  demonstrates global hypokinesis. Left ventricular diastolic parameters are  consistent with Grade I diastolic  dysfunction (impaired relaxation).   2. Right ventricular systolic function is normal. The right ventricular  size is normal. There is normal pulmonary artery systolic pressure.   3. The mitral valve is normal in structure. Mild mitral valve  regurgitation. No evidence of mitral stenosis.   4. The tricuspid valve is abnormal.   5. The aortic valve is tricuspid. Aortic valve regurgitation is not  visualized. No aortic stenosis is present.   6. The inferior vena cava is normal in size with greater than 50%  respiratory variability, suggesting right atrial pressure of 3 mmHg.   Carotid duplex 01/2023: IMPRESSION: 1. Bilateral carotid bifurcation plaque resulting in less than 50% diameter ICA stenosis. 2. Antegrade bilateral vertebral arterial flow.  Physical Exam:   VS:  BP 128/84 (BP Location: Right Arm, Cuff Size: Normal)   Pulse 99   Ht 5\' 9"  (1.753 m)   Wt 162 lb 9.6 oz (73.8 kg)   SpO2 98%   BMI 24.01 kg/m    Wt Readings from Last 3 Encounters:  09/07/23 162 lb 9.6 oz (73.8 kg)  08/01/23 163 lb 9.6 oz (74.2 kg)  07/05/23 156 lb (70.8 kg)    GEN: Well nourished, well developed in no acute distress NECK: No JVD; No carotid bruits CARDIAC: S1/S2, RRR, no  murmurs, rubs, gallops RESPIRATORY:  Clear to auscultation without rales, wheezing or rhonchi  ABDOMEN: Soft, non-tender, non-distended EXTREMITIES:  No edema; left shoulder is not in alignment with right shoulder, no other deformity.  ASSESSMENT AND PLAN: .    HFrEF, medication management Stage C, NYHA class I symptoms. Etiology unclear. Euvolemic and well compensated on exam.  Home BP log shows there is room to [employer], and will increase Entresto to 49/51 mg twice  daily at this time.  Continue rest of GDMT.  Will obtain BMET in 1 to 2 weeks.    Low sodium diet, fluid restriction <2L, and daily weights encouraged. Educated to contact our office for weight gain of 2 lbs overnight or 5 lbs in one week. Plan to update Echo once GDMT is optimal. Care and ED precautions discussed.   2. Hx of ischemic stroke, atypical left-sided chest pain Denies any new or worsening symptoms, does continue to note squeezing/tight sensation with exertion ever since his stroke along left lateral chest/rib cage area, does not sound cardiac in etiology as he denies any chest pain, sounds MSK in etiology.  No indication for ischemic evaluation at this time. Continue ASA and atorvastatin. Heart healthy diet and regular cardiovascular exercise encouraged. Care and ED precautions discussed. Continue to follow with PCP.   3. HTN BP stable with occasional elevated BP readings at home.  Will increase Entresto as noted above.  Continue rest of medication regimen. Discussed to monitor BP at home at least 2 hours after medications and sitting for 5-10 minutes. Given BP log. Heart healthy diet and regular cardiovascular exercise encouraged.    4. Tobacco abuse Smoking cessation encouraged and discussed.   Dispo: Follow-up with me or APP visit in 2-3 months or sooner if anything changes.    Signed, Jermaine Dory, NP

## 2023-09-22 LAB — BASIC METABOLIC PANEL
BUN/Creatinine Ratio: 11 (ref 9–20)
BUN: 13 mg/dL (ref 6–24)
CO2: 23 mmol/L (ref 20–29)
Calcium: 9.2 mg/dL (ref 8.7–10.2)
Chloride: 104 mmol/L (ref 96–106)
Creatinine, Ser: 1.17 mg/dL (ref 0.76–1.27)
Glucose: 138 mg/dL — ABNORMAL HIGH (ref 70–99)
Potassium: 3.6 mmol/L (ref 3.5–5.2)
Sodium: 142 mmol/L (ref 134–144)
eGFR: 74 mL/min/{1.73_m2} (ref >59)

## 2023-11-15 ENCOUNTER — Ambulatory Visit: Payer: BC Managed Care – PPO | Admitting: Nurse Practitioner

## 2023-11-15 ENCOUNTER — Ambulatory Visit: Payer: BC Managed Care – PPO | Admitting: Internal Medicine

## 2023-11-17 ENCOUNTER — Ambulatory Visit: Payer: Self-pay | Admitting: Nurse Practitioner

## 2023-12-22 ENCOUNTER — Other Ambulatory Visit: Payer: Self-pay

## 2023-12-22 ENCOUNTER — Emergency Department (HOSPITAL_COMMUNITY)
Admission: EM | Admit: 2023-12-22 | Discharge: 2023-12-22 | Disposition: A | Discharge status: Home / Self Care | Payer: Self-pay | Attending: Emergency Medicine | Admitting: Emergency Medicine

## 2023-12-22 ENCOUNTER — Encounter (HOSPITAL_COMMUNITY): Payer: Self-pay

## 2023-12-22 ENCOUNTER — Emergency Department (HOSPITAL_COMMUNITY): Payer: Self-pay

## 2023-12-22 DIAGNOSIS — I10 Essential (primary) hypertension: Secondary | ICD-10-CM | POA: Insufficient documentation

## 2023-12-22 DIAGNOSIS — Z7982 Long term (current) use of aspirin: Secondary | ICD-10-CM | POA: Insufficient documentation

## 2023-12-22 DIAGNOSIS — Z79899 Other long term (current) drug therapy: Secondary | ICD-10-CM | POA: Insufficient documentation

## 2023-12-22 DIAGNOSIS — R519 Headache, unspecified: Secondary | ICD-10-CM | POA: Insufficient documentation

## 2023-12-22 LAB — I-STAT CHEM 8, ED
BUN: 15 mg/dL (ref 6–20)
Calcium, Ion: 1.15 mmol/L (ref 1.15–1.40)
Chloride: 105 mmol/L (ref 98–111)
Creatinine, Ser: 1.1 mg/dL (ref 0.61–1.24)
Glucose, Bld: 102 mg/dL — ABNORMAL HIGH (ref 70–99)
HCT: 44 % (ref 39.0–52.0)
Hemoglobin: 15 g/dL (ref 13.0–17.0)
Potassium: 3.6 mmol/L (ref 3.5–5.1)
Sodium: 140 mmol/L (ref 135–145)
TCO2: 23 mmol/L (ref 22–32)

## 2023-12-22 MED ORDER — DAPAGLIFLOZIN PROPANEDIOL 10 MG PO TABS: 10.0000 mg | ORAL_TABLET | Freq: Every day | ORAL | 1 refills | Status: AC

## 2023-12-22 MED ORDER — METOPROLOL TARTRATE 25 MG PO TABS
25.0000 mg | ORAL_TABLET | Freq: Two times a day (BID) | ORAL | 1 refills | Status: AC
Start: 2023-12-22 — End: ?

## 2023-12-22 MED ORDER — SACUBITRIL-VALSARTAN 49-51 MG PO TABS: 1.0000 | ORAL_TABLET | Freq: Two times a day (BID) | ORAL | 1 refills | Status: AC

## 2023-12-22 MED ORDER — SACUBITRIL-VALSARTAN 49-51 MG PO TABS
1.0000 | ORAL_TABLET | Freq: Two times a day (BID) | ORAL | Status: DC
  Administered 2023-12-22: 1 via ORAL

## 2023-12-22 MED ORDER — METOPROLOL TARTRATE 25 MG PO TABS
25.0000 mg | ORAL_TABLET | Freq: Once | ORAL | Status: AC
  Administered 2023-12-22: 25 mg via ORAL
  Filled 2023-12-22: qty 1

## 2023-12-22 MED ORDER — ACETAMINOPHEN 500 MG PO TABS
1000.0000 mg | ORAL_TABLET | Freq: Once | ORAL | Status: AC
  Administered 2023-12-22: 1000 mg via ORAL
  Filled 2023-12-22: qty 2

## 2023-12-22 MED ORDER — SPIRONOLACTONE 25 MG PO TABS
25.0000 mg | ORAL_TABLET | Freq: Every day | ORAL | Status: DC
  Administered 2023-12-22: 25 mg via ORAL
  Filled 2023-12-22 (×2): qty 1

## 2023-12-22 MED ORDER — SPIRONOLACTONE 25 MG PO TABS: 25.0000 mg | ORAL_TABLET | Freq: Every day | ORAL | 1 refills | Status: AC

## 2023-12-22 NOTE — ED Triage Notes (Signed)
 Pt presents with headache constantly since yesterday. Took tylenol with no relief. Hx of hypertension. Pt has been out of his BP medications x2 months.

## 2023-12-23 NOTE — ED Provider Notes (Signed)
 [facility] AT [facility] Provider Note   CSN: [id number] Arrival date & time: 12/22/23  0253     History  Chief Complaint  Patient presents with   Hypertension    Jermaine Shaw is a 53 y.o. male.  53 year old male with a history of hypertension not on medication recently secondary to no insurance who presents to the ER today secondary to headache and high blood pressure.  Patient states this happens often for him.  Been going on off and on for the last few days but definitely worse today.  No neurologic changes.  No shortness of breath, chest pain, belly pain, back pain or other associated symptoms.  Not sure when he can bill to see a PCP again but thinks it should be within a month.   Hypertension       Home Medications Prior to Admission medications   Medication Sig Start Date End Date Taking? Authorizing Provider  dapagliflozin propanediol (FARXIGA) 10 MG TABS tablet Take 1 tablet (10 mg total) by mouth daily before breakfast. 12/22/23  Yes Anique Beckley, MD  metoprolol tartrate (LOPRESSOR) 25 MG tablet Take 1 tablet (25 mg total) by mouth 2 (two) times daily. 12/22/23  Yes Kiko Ripp, Reymundo Caulk, MD  sacubitril-valsartan (ENTRESTO) 49-51 MG Take 1 tablet by mouth 2 (two) times daily. 12/22/23  Yes Janvi Ammar, Reymundo Caulk, MD  spironolactone (ALDACTONE) 25 MG tablet Take 1 tablet (25 mg total) by mouth daily. 12/22/23  Yes Ioane Bhola, Reymundo Caulk, MD  aspirin EC 81 MG tablet Take 1 tablet (81 mg total) by mouth daily. Swallow whole. 02/02/23   Shahmehdi, Constantino Demark, MD  Blood Glucose Monitoring Suppl (ACCU-CHEK GUIDE ME) w/Device KIT USE TO CHECK GLUCOSE ONCE DAILY 08/09/23   [provider]  Blood Pressure Monitor MISC 1 each by Does not apply route as directed. Adult BP monitor dx: hypertension & stroke 02/15/23   Lasalle Pointer, NP  famotidine (PEPCID) 40 MG tablet Take 1 tablet (40 mg total) by mouth every evening. 02/01/23 02/01/24  Bobbetta Burnet, MD       Allergies    Patient has no known allergies.    Review of Systems   Review of Systems  Physical Exam Updated Vital Signs BP (!) 186/99   Pulse 85   Temp 97.8 F (36.6 C)   Resp 15   SpO2 94%  Physical Exam Vitals and nursing note reviewed.  Constitutional:      Appearance: He is well-developed.  HENT:     Head: Normocephalic and atraumatic.     Mouth/Throat:     Mouth: Mucous membranes are moist.  Eyes:     Pupils: Pupils are equal, round, and reactive to light.  Cardiovascular:     Rate and Rhythm: Normal rate.  Pulmonary:     Effort: Pulmonary effort is normal. No respiratory distress.  Abdominal:     General: There is no distension.  Musculoskeletal:        General: Normal range of motion.     Cervical back: Normal range of motion.  Skin:    General: Skin is warm and dry.  Neurological:     General: No focal deficit present.     Mental Status: He is alert. Mental status is at baseline.     Comments: No altered mental status, able to give full seemingly accurate history.  Face is symmetric, EOM's intact, pupils equal and reactive, vision intact, tongue and uvula midline without deviation. Upper and Lower extremity motor 5/5,  intact pain perception in distal extremities, 2+ reflexes in biceps, patella and achilles tendons. Able to perform finger to nose normal with both hands. Walks without assistance or evident ataxia.       ED Results / Procedures / Treatments   Labs (all labs ordered are listed, but only abnormal results are displayed) Labs Reviewed  I-STAT CHEM 8, ED - Abnormal; Notable for the following components:      Result Value   Glucose, Bld 102 (*)    All other components within normal limits    EKG EKG Interpretation Date/Time:  Friday December 22 2023 03:15:12 EDT Ventricular Rate:  86 PR Interval:  136 QRS Duration:  89 QT Interval:  391 QTC Calculation: 468 R Axis:   54  Text Interpretation: Sinus rhythm Probable left atrial  enlargement Nonspecific T abnormalities, lateral leads Confirmed by Eve Hinders [phone]) on 12/22/2023 4:57:25 AM  Radiology CT Head Wo Contrast Result Date: 12/22/2023 CLINICAL DATA:  Headache and hypertension. Out of blood pressure medication for 2 months EXAM: CT HEAD WITHOUT CONTRAST TECHNIQUE: Contiguous axial images were obtained from the base of the skull through the vertex without intravenous contrast. RADIATION DOSE REDUCTION: This exam was performed according to the departmental dose-optimization program which includes automated exposure control, adjustment of the mA and/or kV according to patient size and/or use of iterative reconstruction technique. COMPARISON:  02/16/2023 FINDINGS: Brain: Low-density in the cerebral white matter with discrete lacunar type spaces in the right pons and left corona radiata. No evidence of acute infarct, hemorrhage, hydrocephalus, mass, or collection. Brain volume remains normal. Vascular: No hyperdense vessel or unexpected calcification. 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 Sinuses/Orbits: No acute finding. IMPRESSION: No acute finding.   Stable chronic white matter disease. Electronically Signed   By: Ronnette Coke M.D.   On: 12/22/2023 05:28    Procedures Procedures    Medications Ordered in ED Medications  metoprolol tartrate (LOPRESSOR) tablet 25 mg (25 mg Oral Given 12/22/23 0348)  acetaminophen (TYLENOL) tablet 1,000 mg (1,000 mg Oral Given 12/22/23 0540)    ED Course/ Medical Decision Making/ A&P                                 Medical Decision Making Amount and/or Complexity of Data Reviewed Radiology: ordered.  Risk OTC drugs. Prescription drug management.   CT head Diamond evaluate for any possible hemorrhage, space-occupying lesion or other associated etiology this was viewed interpreted by myself as being normal.  Radiology read reviewed as well.  Rest of workup is reassuring.  At this  point I suspect that the blood pressure is causing a headache but there is no evidence of endorgan damage related to the same.  I think patient is stable for discharge.  Started back his home medications that he had been on most recently.  Instructed to call the number on the paper to follow-up with somebody for further management and reasons to return to the ED for repeat evaluation.   Final Clinical Impression(s) / ED Diagnoses Final diagnoses:  Hypertension, unspecified type    Rx / DC Orders ED Discharge Orders          Ordered    metoprolol tartrate (LOPRESSOR) 25 MG tablet  2 times daily        12/22/23 0534    sacubitril-valsartan (ENTRESTO) 49-51 MG  2 times daily  12/22/23 0534    spironolactone (ALDACTONE) 25 MG tablet  Daily        12/22/23 0534    dapagliflozin propanediol (FARXIGA) 10 MG TABS tablet  Daily before breakfast        12/22/23 0534              Kolbie Clarkston, Reymundo Caulk, MD 12/23/23 [phone]

## 2024-07-17 ENCOUNTER — Telehealth: Payer: Self-pay | Admitting: *Deleted

## 2024-07-17 NOTE — Telephone Encounter (Signed)
 Pt medical records faxed to [facility] on 07/17/2024
